# Patient Record
Sex: Female | Born: 1970 | Race: Black or African American | Hispanic: No | Marital: Single | Smoking: Never smoker
Health system: Southern US, Community
[De-identification: ages and names within clinical notes are randomized; demographics above are authoritative.]

## PROBLEM LIST (undated history)

## (undated) DIAGNOSIS — R1310 Dysphagia, unspecified: Secondary | ICD-10-CM

## (undated) DIAGNOSIS — K449 Diaphragmatic hernia without obstruction or gangrene: Secondary | ICD-10-CM

## (undated) DIAGNOSIS — K220 Achalasia of cardia: Secondary | ICD-10-CM

## (undated) DIAGNOSIS — D649 Anemia, unspecified: Secondary | ICD-10-CM

## (undated) DIAGNOSIS — J352 Hypertrophy of adenoids: Secondary | ICD-10-CM

## (undated) DIAGNOSIS — R079 Chest pain, unspecified: Secondary | ICD-10-CM

## (undated) DIAGNOSIS — I10 Essential (primary) hypertension: Secondary | ICD-10-CM

## (undated) DIAGNOSIS — Z789 Other specified health status: Secondary | ICD-10-CM

## (undated) HISTORY — DX: Dysphagia, unspecified: R13.10

## (undated) HISTORY — DX: Diaphragmatic hernia without obstruction or gangrene: K44.9

## (undated) HISTORY — DX: Achalasia of cardia: K22.0

## (undated) HISTORY — DX: Essential (primary) hypertension: I10

## (undated) HISTORY — DX: Chest pain, unspecified: R07.9

## (undated) HISTORY — DX: Anemia, unspecified: D64.9

## (undated) HISTORY — DX: Hypertrophy of adenoids: J35.2

## (undated) HISTORY — PX: NECK SURGERY: SHX720

## (undated) HISTORY — DX: Other specified health status: Z78.9

---

## 1998-06-14 ENCOUNTER — Other Ambulatory Visit: Admission: RE | Admit: 1998-06-14 | Discharge: 1998-06-14 | Payer: Self-pay

## 1998-06-27 ENCOUNTER — Ambulatory Visit (HOSPITAL_COMMUNITY): Admission: RE | Admit: 1998-06-27 | Discharge: 1998-06-27 | Payer: Self-pay | Admitting: *Deleted

## 1998-09-02 ENCOUNTER — Inpatient Hospital Stay (HOSPITAL_COMMUNITY): Admission: AD | Admit: 1998-09-02 | Discharge: 1998-09-02 | Payer: Self-pay | Admitting: *Deleted

## 1998-11-20 ENCOUNTER — Inpatient Hospital Stay (HOSPITAL_COMMUNITY): Admission: AD | Admit: 1998-11-20 | Discharge: 1998-11-20 | Payer: Self-pay | Admitting: Obstetrics

## 1998-11-21 ENCOUNTER — Inpatient Hospital Stay (HOSPITAL_COMMUNITY): Admission: AD | Admit: 1998-11-21 | Discharge: 1998-11-23 | Payer: Self-pay | Admitting: *Deleted

## 1999-03-08 ENCOUNTER — Emergency Department (HOSPITAL_COMMUNITY): Admission: EM | Admit: 1999-03-08 | Discharge: 1999-03-09 | Payer: Self-pay | Admitting: Emergency Medicine

## 1999-03-10 ENCOUNTER — Emergency Department (HOSPITAL_COMMUNITY): Admission: EM | Admit: 1999-03-10 | Discharge: 1999-03-10 | Payer: Self-pay | Admitting: Emergency Medicine

## 1999-04-02 ENCOUNTER — Emergency Department (HOSPITAL_COMMUNITY): Admission: EM | Admit: 1999-04-02 | Discharge: 1999-04-02 | Payer: Self-pay | Admitting: Emergency Medicine

## 1999-04-04 ENCOUNTER — Emergency Department (HOSPITAL_COMMUNITY): Admission: EM | Admit: 1999-04-04 | Discharge: 1999-04-04 | Payer: Self-pay | Admitting: Internal Medicine

## 1999-10-24 ENCOUNTER — Emergency Department (HOSPITAL_COMMUNITY): Admission: EM | Admit: 1999-10-24 | Discharge: 1999-10-24 | Payer: Self-pay | Admitting: Emergency Medicine

## 1999-12-06 ENCOUNTER — Emergency Department (HOSPITAL_COMMUNITY): Admission: EM | Admit: 1999-12-06 | Discharge: 1999-12-06 | Payer: Self-pay | Admitting: Emergency Medicine

## 1999-12-08 ENCOUNTER — Emergency Department (HOSPITAL_COMMUNITY): Admission: EM | Admit: 1999-12-08 | Discharge: 1999-12-08 | Payer: Self-pay | Admitting: Emergency Medicine

## 2000-07-17 ENCOUNTER — Emergency Department (HOSPITAL_COMMUNITY): Admission: EM | Admit: 2000-07-17 | Discharge: 2000-07-17 | Payer: Self-pay | Admitting: Emergency Medicine

## 2000-11-18 ENCOUNTER — Emergency Department (HOSPITAL_COMMUNITY): Admission: EM | Admit: 2000-11-18 | Discharge: 2000-11-18 | Payer: Self-pay | Admitting: Emergency Medicine

## 2001-02-14 ENCOUNTER — Emergency Department (HOSPITAL_COMMUNITY): Admission: EM | Admit: 2001-02-14 | Discharge: 2001-02-14 | Payer: Self-pay | Admitting: *Deleted

## 2001-02-18 ENCOUNTER — Emergency Department (HOSPITAL_COMMUNITY): Admission: EM | Admit: 2001-02-18 | Discharge: 2001-02-18 | Payer: Self-pay | Admitting: Emergency Medicine

## 2003-03-14 ENCOUNTER — Emergency Department (HOSPITAL_COMMUNITY): Admission: EM | Admit: 2003-03-14 | Discharge: 2003-03-14 | Payer: Self-pay | Admitting: Emergency Medicine

## 2004-01-06 ENCOUNTER — Emergency Department (HOSPITAL_COMMUNITY): Admission: EM | Admit: 2004-01-06 | Discharge: 2004-01-06 | Payer: Self-pay | Admitting: Emergency Medicine

## 2004-02-24 ENCOUNTER — Emergency Department (HOSPITAL_COMMUNITY): Admission: EM | Admit: 2004-02-24 | Discharge: 2004-02-24 | Payer: Self-pay | Admitting: *Deleted

## 2004-07-10 ENCOUNTER — Emergency Department (HOSPITAL_COMMUNITY): Admission: EM | Admit: 2004-07-10 | Discharge: 2004-07-10 | Payer: Self-pay | Admitting: Emergency Medicine

## 2005-04-19 ENCOUNTER — Emergency Department (HOSPITAL_COMMUNITY): Admission: EM | Admit: 2005-04-19 | Discharge: 2005-04-19 | Payer: Self-pay | Admitting: Emergency Medicine

## 2005-09-24 ENCOUNTER — Emergency Department (HOSPITAL_COMMUNITY): Admission: EM | Admit: 2005-09-24 | Discharge: 2005-09-24 | Payer: Self-pay | Admitting: Emergency Medicine

## 2005-09-30 ENCOUNTER — Emergency Department (HOSPITAL_COMMUNITY): Admission: EM | Admit: 2005-09-30 | Discharge: 2005-09-30 | Payer: Self-pay | Admitting: Emergency Medicine

## 2005-10-02 ENCOUNTER — Emergency Department (HOSPITAL_COMMUNITY): Admission: EM | Admit: 2005-10-02 | Discharge: 2005-10-02 | Payer: Self-pay | Admitting: Emergency Medicine

## 2005-12-18 ENCOUNTER — Other Ambulatory Visit: Admission: RE | Admit: 2005-12-18 | Discharge: 2005-12-18 | Payer: Self-pay | Admitting: Obstetrics and Gynecology

## 2006-07-03 ENCOUNTER — Emergency Department (HOSPITAL_COMMUNITY): Admission: EM | Admit: 2006-07-03 | Discharge: 2006-07-03 | Payer: Self-pay | Admitting: Family Medicine

## 2006-07-05 ENCOUNTER — Emergency Department (HOSPITAL_COMMUNITY): Admission: EM | Admit: 2006-07-05 | Discharge: 2006-07-05 | Payer: Self-pay | Admitting: Family Medicine

## 2007-03-09 ENCOUNTER — Emergency Department (HOSPITAL_COMMUNITY): Admission: EM | Admit: 2007-03-09 | Discharge: 2007-03-09 | Payer: Self-pay | Admitting: Family Medicine

## 2007-03-15 ENCOUNTER — Emergency Department (HOSPITAL_COMMUNITY): Admission: EM | Admit: 2007-03-15 | Discharge: 2007-03-15 | Payer: Self-pay | Admitting: Emergency Medicine

## 2007-03-17 ENCOUNTER — Emergency Department (HOSPITAL_COMMUNITY): Admission: EM | Admit: 2007-03-17 | Discharge: 2007-03-17 | Payer: Self-pay | Admitting: Family Medicine

## 2007-03-19 ENCOUNTER — Emergency Department (HOSPITAL_COMMUNITY): Admission: EM | Admit: 2007-03-19 | Discharge: 2007-03-19 | Payer: Self-pay | Admitting: Family Medicine

## 2009-12-23 ENCOUNTER — Emergency Department (HOSPITAL_COMMUNITY): Admission: EM | Admit: 2009-12-23 | Discharge: 2009-12-23 | Payer: Self-pay | Admitting: Family Medicine

## 2010-03-14 ENCOUNTER — Emergency Department (HOSPITAL_COMMUNITY): Admission: EM | Admit: 2010-03-14 | Discharge: 2010-03-14 | Payer: Self-pay | Admitting: Emergency Medicine

## 2010-07-30 ENCOUNTER — Encounter: Admission: RE | Admit: 2010-07-30 | Discharge: 2010-07-30 | Payer: Self-pay | Admitting: Family Medicine

## 2010-08-12 ENCOUNTER — Emergency Department (HOSPITAL_COMMUNITY): Admission: EM | Admit: 2010-08-12 | Discharge: 2010-08-12 | Payer: Self-pay | Admitting: Emergency Medicine

## 2010-08-14 ENCOUNTER — Emergency Department (HOSPITAL_COMMUNITY): Admission: EM | Admit: 2010-08-14 | Discharge: 2010-08-14 | Payer: Self-pay | Admitting: Emergency Medicine

## 2010-09-23 ENCOUNTER — Emergency Department (HOSPITAL_COMMUNITY): Admission: EM | Admit: 2010-09-23 | Discharge: 2010-09-23 | Payer: Self-pay | Admitting: Family Medicine

## 2010-11-25 ENCOUNTER — Emergency Department (HOSPITAL_COMMUNITY)
Admission: EM | Admit: 2010-11-25 | Discharge: 2010-11-25 | Payer: Self-pay | Source: Home / Self Care | Admitting: Family Medicine

## 2011-01-15 LAB — POCT RAPID STREP A (OFFICE): Streptococcus, Group A Screen (Direct): POSITIVE — AB

## 2011-05-08 ENCOUNTER — Inpatient Hospital Stay (INDEPENDENT_AMBULATORY_CARE_PROVIDER_SITE_OTHER)
Admission: RE | Admit: 2011-05-08 | Discharge: 2011-05-08 | Disposition: A | Discharge status: Home / Self Care | Payer: BC Managed Care – PPO | Source: Ambulatory Visit | Attending: Family Medicine | Admitting: Family Medicine

## 2011-05-08 DIAGNOSIS — H6120 Impacted cerumen, unspecified ear: Secondary | ICD-10-CM

## 2011-05-20 ENCOUNTER — Inpatient Hospital Stay (INDEPENDENT_AMBULATORY_CARE_PROVIDER_SITE_OTHER)
Admission: RE | Admit: 2011-05-20 | Discharge: 2011-05-20 | Disposition: A | Discharge status: Home / Self Care | Payer: BC Managed Care – PPO | Source: Ambulatory Visit | Attending: Family Medicine | Admitting: Family Medicine

## 2011-05-20 DIAGNOSIS — J029 Acute pharyngitis, unspecified: Secondary | ICD-10-CM

## 2011-09-08 ENCOUNTER — Other Ambulatory Visit (HOSPITAL_COMMUNITY): Payer: Self-pay | Admitting: Otolaryngology

## 2011-09-10 ENCOUNTER — Other Ambulatory Visit (HOSPITAL_COMMUNITY): Payer: Self-pay | Admitting: Otolaryngology

## 2011-09-11 ENCOUNTER — Ambulatory Visit (HOSPITAL_COMMUNITY)
Admission: RE | Admit: 2011-09-11 | Discharge: 2011-09-11 | Disposition: A | Discharge status: Home / Self Care | Payer: BC Managed Care – PPO | Source: Ambulatory Visit | Attending: Otolaryngology | Admitting: Otolaryngology

## 2011-09-11 ENCOUNTER — Other Ambulatory Visit (HOSPITAL_COMMUNITY): Payer: BC Managed Care – PPO

## 2011-09-11 DIAGNOSIS — R1310 Dysphagia, unspecified: Secondary | ICD-10-CM | POA: Insufficient documentation

## 2011-09-11 NOTE — Procedures (Signed)
Clinical Impressions:  Pt presents with normal oropharyngeal swallow.  Esophageal screen revealed slowed emptying of solid foods through LES.  Pt for esophagram after MBS -  refer to that study for detailed information. Recommendations:  Continue regular diet with thin liquids; alternate solids with liquids to facilitate solid food transfer.

## 2011-09-15 ENCOUNTER — Encounter: Payer: Self-pay | Admitting: Gastroenterology

## 2011-10-01 ENCOUNTER — Ambulatory Visit (INDEPENDENT_AMBULATORY_CARE_PROVIDER_SITE_OTHER): Payer: BC Managed Care – PPO | Admitting: Gastroenterology

## 2011-10-01 ENCOUNTER — Encounter: Payer: Self-pay | Admitting: Gastroenterology

## 2011-10-01 DIAGNOSIS — R1310 Dysphagia, unspecified: Secondary | ICD-10-CM

## 2011-10-01 DIAGNOSIS — I10 Essential (primary) hypertension: Secondary | ICD-10-CM

## 2011-10-01 NOTE — Patient Instructions (Addendum)
You will be set up for an upper endoscopy. Samples of PPI given, take one pill once daily 20-30 min before a meal. GERD handout given.   Gastroesophageal reflux disease (GERD) happens when acid from your stomach flows up into the esophagus. When acid comes in contact with the esophagus, the acid causes soreness (inflammation) in the esophagus. Over time, GERD may create small holes (ulcers) in the lining of the esophagus. CAUSES   Increased body weight. This puts pressure on the stomach, making acid rise from the stomach into the esophagus.   Smoking. This increases acid production in the stomach.   Drinking alcohol. This causes decreased pressure in the lower esophageal sphincter (valve or ring of muscle between the esophagus and stomach), allowing acid from the stomach into the esophagus.   Late evening meals and a full stomach. This increases pressure and acid production in the stomach.   A malformed lower esophageal sphincter.  Sometimes, no cause is found. SYMPTOMS   Burning pain in the lower part of the mid-chest behind the breastbone and in the mid-stomach area. This may occur twice a week or more often.   Trouble swallowing.   Sore throat.   Dry cough.   Asthma-like symptoms including chest tightness, shortness of breath, or wheezing.  DIAGNOSIS  Your caregiver may be able to diagnose GERD based on your symptoms. In some cases, X-rays and other tests may be done to check for complications or to check the condition of your stomach and esophagus. TREATMENT  Your caregiver may recommend over-the-counter or prescription medicines to help decrease acid production. Ask your caregiver before starting or adding any new medicines.  HOME CARE INSTRUCTIONS   Change the factors that you can control. Ask your caregiver for guidance concerning weight loss, quitting smoking, and alcohol consumption.   Avoid foods and drinks that make your symptoms worse, such as:   Caffeine or  alcoholic drinks.   Chocolate.   Peppermint or mint flavorings.   Garlic and onions.   Spicy foods.   Citrus fruits, such as oranges, lemons, or limes.   Tomato-based foods such as sauce, chili, salsa, and pizza.   Fried and fatty foods.   Avoid lying down for the 3 hours prior to your bedtime or prior to taking a nap.   Eat small, frequent meals instead of large meals.   Wear loose-fitting clothing. Do not wear anything tight around your waist that causes pressure on your stomach.   Raise the head of your bed 6 to 8 inches with wood blocks to help you sleep. Extra pillows will not help.   Only take over-the-counter or prescription medicines for pain, discomfort, or fever as directed by your caregiver.   Do not take aspirin, ibuprofen, or other nonsteroidal anti-inflammatory drugs (NSAIDs).  SEEK IMMEDIATE MEDICAL CARE IF:   You have pain in your arms, neck, jaw, teeth, or back.   Your pain increases or changes in intensity or duration.   You develop nausea, vomiting, or sweating (diaphoresis).   You develop shortness of breath, or you faint.   Your vomit is green, yellow, black, or looks like coffee grounds or blood.   Your stool is red, bloody, or black.  These symptoms could be signs of other problems, such as heart disease, gastric bleeding, or esophageal bleeding. MAKE SURE YOU:   Understand these instructions.   Will watch your condition.   Will get help right away if you are not doing well or get worse.  Document  Released: 07/23/2005 Document Revised: 06/25/2011 Document Reviewed: 05/02/2011 [facility] Patient Information 2012 [place], [place].

## 2011-10-01 NOTE — Progress Notes (Signed)
MODIFIED BARIUM SWALLOW [SLP1002] last month       Clinical Impressions: Pt presents with normal oropharyngeal swallow. Esophageal screen revealed slowed emptying of solid foods through LES. Pt for esophagram after MBS - refer to that study for detailed information.  Recommendations: Continue regular diet with thin liquids; alternate solids with liquids to facilitate solid food transfer.    Barium esophagram last month: IMPRESSION:  1. Esophageal dysmotility.  2. Tiny hiatal hernia.  3. Delayed passage of a barium pill at the distal esophagus. This  is presumably related to dysmotility.   HPI: This is a  very pleasant 40 year old woman whom I am meeting for the first time today  Having swalloiwng troubles for 8 months.  When she swallows feels like something is stuck in throat, has to swallow very hard to get it down.  Has to retch to get it out at times.  There is no dysphagia.  She feels like choking.  OVerall her weight is up 15 pounds this year. Not a lot of pyrosis, does not take meds for it.  Has intermittent pains in chest that have been attributed to esophag spasms.   Took nexium, and the 3 days of pains improved.  Took nexium for 4-5 days then stopped.   She was on antibiotics 6 months ago for skin boil.  This was probably after the   ENT MD did examination.   Dysphagia is not progressive, is mainly solids.    She has daily dysphagia, 2 times a week severe symptoms.  She had the modified barium swallow and barium esophagram, and those results are summarized above.   She does not drink much caffeine or alcohol and does not smoke cigarettes. She does chew peppermint gum quite frequently  Review of systems: Pertinent positive and negative review of systems were noted in the above HPI section. Complete review of systems was performed and was otherwise normal.    Past Medical History  Diagnosis Date  . Chest pain, unspecified   . Unspecified essential hypertension   .  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 Achalasia and cardiospasm   . Hypertrophy of adenoids alone   . Dysphagia, unspecified   . Anemia     History reviewed. No pertinent past surgical history.  Current Outpatient Prescriptions  Medication Sig Dispense Refill  . hydrochlorothiazide (HYDRODIURIL) 25 MG tablet Take 25 mg by mouth daily.          Allergies as of 10/01/2011  . (No Known Allergies)    Family History  Problem Relation Age of Onset  . Diabetes Father   . Hypertension Mother   . Stroke Mother     History   Social History  . Marital Status: Single    Spouse Name: N/A    Number of Children: 1  . Years of Education: N/A   Occupational History  . Geologist, engineering    Social History Main Topics  . Smoking status: Never Smoker   . Smokeless tobacco: Never Used  . Alcohol Use: No  . Drug Use: No  . Sexually Active: Not on file   Other Topics Concern  . Not on file   Social History Narrative  . No narrative on file       Physical Exam: BP 120/72  Pulse 80  Ht 5\' 7"  (1.702 m)  Wt 238 lb (107.956 kg)  BMI 37.28 kg/m2  SpO2 98%  LMP 08/23/2011 Constitutional: generally well-appearing Psychiatric: alert and oriented x3  Eyes: extraocular movements intact Mouth: oral pharynx moist, no lesions Neck: supple no lymphadenopathy Cardiovascular: heart regular rate and rhythm Lungs: clear to auscultation bilaterally Abdomen: soft, nontender, nondistended, no obvious ascites, no peritoneal signs, normal bowel sounds Extremities: no lower extremity edema bilaterally Skin: no lesions on visible extremities    Assessment and plan: 40 y.o. female with   intermittent, nonprogressive, predominantly solid food dysphasia  I will proceed with EGD at her soonest convenience. In the meantime she will begin proton pump inhibitor once daily as a clinical test to see if her symptoms are gerd related. Other possibilities include  infection, motility disorder, stricture.

## 2011-11-03 ENCOUNTER — Encounter: Payer: Self-pay | Admitting: Gastroenterology

## 2011-11-03 ENCOUNTER — Ambulatory Visit (AMBULATORY_SURGERY_CENTER): Payer: BC Managed Care – PPO | Admitting: Gastroenterology

## 2011-11-03 VITALS — BP 126/74 | HR 90 | Temp 99.0°F | Resp 18 | Ht 67.0 in | Wt 238.0 lb

## 2011-11-03 DIAGNOSIS — R1310 Dysphagia, unspecified: Secondary | ICD-10-CM

## 2011-11-03 MED ORDER — SODIUM CHLORIDE 0.9 % IV SOLN: 500.0000 mL | INTRAVENOUS | Status: DC

## 2011-11-03 MED ORDER — OMEPRAZOLE 40 MG PO CPDR: 40.0000 mg | DELAYED_RELEASE_CAPSULE | Freq: Every day | ORAL | Status: DC

## 2011-11-03 NOTE — Op Note (Signed)
[facility] [address] [place], [place]  [address]  ENDOSCOPY PROCEDURE REPORT  PATIENT:  Martha Davis, Martha Davis  MR#:  [id number] BIRTHDATE:  06-Aug-1971, 40 yrs. old  GENDER:  female ENDOSCOPIST:  Rachael Fee, MD Referred by:  Alwyn Pea, M.D. PROCEDURE DATE:  11/03/2011 PROCEDURE:  EGD with biopsy, 432[address] ASA CLASS:  Class II INDICATIONS:  GERD, intermittent dysphagia MEDICATIONS:   Fentanyl 100 mcg IV, These medications were titrated to patient response per physician's verbal order, Versed 10 mg IV TOPICAL ANESTHETIC:  Cetacaine Spray  DESCRIPTION OF PROCEDURE:   After the risks benefits and alternatives of the procedure were thoroughly explained, informed consent was obtained.  The [facility] [id number] endoscope was introduced through the mouth and advanced to the second portion of the duodenum, without limitations.  The instrument was slowly withdrawn as the mucosa was fully examined. <<PROCEDUREIMAGES>>  The upper, middle, and distal third of the esophagus were carefully inspected and no abnormalities were noted. The z-line was well seen at the GEJ. The endoscope was pushed into the fundus which was normal including a retroflexed view. The antrum,gastric body, first and second part of the duodenum were unremarkable (see image1, image2, and image3).  Biopsies were taken from distal (jar 1) andproximal (jar 2) esophagus. Retroflexed views revealed no abnormalities.    The scope was then withdrawn from the patient and the procedure completed.  COMPLICATIONS:  None  ENDOSCOPIC IMPRESSION: 1) Normal EGD 2) Esophagus was biopsied to check for eosinophilic esophagitis  RECOMMENDATIONS: Await final biopsies to check for Eosinophilic Esophagitis. In the meantime a new prescription was called into your pharmacy for antiacid medicine since the PPI samples did seem to help your symptoms.  ______________________________ Rachael Fee, MD  n. eSIGNED:   Rachael Fee at 11/03/2011 09:42 AM  Minda Meo, [phone]

## 2011-11-03 NOTE — Progress Notes (Signed)
Patient did not have preoperative order for IV antibiotic SSI prophylaxis. (G8918)  Patient did not experience any of the following events: a burn prior to discharge; a fall within the facility; wrong site/side/patient/procedure/implant event; or a hospital transfer or hospital admission upon discharge from the facility. (G8907)  

## 2011-11-03 NOTE — Patient Instructions (Signed)
Your biopsies will be back in about two weeks.   The results will be mailed to your home.  You may resume all of your routine meds with the exception of the new medication (prilosec). Please take that once daily 1/2 hr before breakfast.  If you have any questions, please call us at [phone]. Thank-you.

## 2011-11-04 ENCOUNTER — Telehealth: Payer: Self-pay | Admitting: *Deleted

## 2011-11-04 NOTE — Telephone Encounter (Signed)
No answer. Message left. 

## 2011-11-10 ENCOUNTER — Encounter: Payer: Self-pay | Admitting: Gastroenterology

## 2011-11-11 ENCOUNTER — Encounter: Payer: Self-pay | Admitting: *Deleted

## 2012-02-12 DIAGNOSIS — D219 Benign neoplasm of connective and other soft tissue, unspecified: Secondary | ICD-10-CM

## 2012-02-12 DIAGNOSIS — N921 Excessive and frequent menstruation with irregular cycle: Secondary | ICD-10-CM

## 2012-02-12 DIAGNOSIS — D649 Anemia, unspecified: Secondary | ICD-10-CM

## 2012-02-13 ENCOUNTER — Ambulatory Visit (INDEPENDENT_AMBULATORY_CARE_PROVIDER_SITE_OTHER): Payer: BC Managed Care – PPO | Admitting: Obstetrics and Gynecology

## 2012-02-13 ENCOUNTER — Other Ambulatory Visit: Payer: Self-pay | Admitting: Obstetrics and Gynecology

## 2012-02-13 ENCOUNTER — Ambulatory Visit (INDEPENDENT_AMBULATORY_CARE_PROVIDER_SITE_OTHER): Payer: BC Managed Care – PPO

## 2012-02-13 ENCOUNTER — Encounter: Payer: Self-pay | Admitting: Obstetrics and Gynecology

## 2012-02-13 DIAGNOSIS — D259 Leiomyoma of uterus, unspecified: Secondary | ICD-10-CM

## 2012-02-13 DIAGNOSIS — N920 Excessive and frequent menstruation with regular cycle: Secondary | ICD-10-CM

## 2012-02-13 DIAGNOSIS — N921 Excessive and frequent menstruation with irregular cycle: Secondary | ICD-10-CM

## 2012-02-13 NOTE — Progress Notes (Signed)
Sonohysterogram: Irreg endometrium, no submucosal fibroid  ENDOMETRIAL BIOPSY   INDICATION: intermenstrual bleeding  LMP:  UPT: Negative  Ultrasound findings: abnormal  with multiple fibroids   Cervix prepped with betadine Tenaculum applied to anterior lip of the cervix: no Uterus sounded at  10  cm Endometrial biopsy easily performed with Pipelle Well tolerated  Specimen appropriately identified and sent to pathology  Follow-up:  Will schedule abdominal myomectomy per pt request   Chaslyn Eisen A MD 02/13/2012 3:58 PM

## 2012-02-17 ENCOUNTER — Telehealth: Payer: Self-pay | Admitting: Obstetrics and Gynecology

## 2012-02-17 LAB — PATHOLOGY

## 2012-02-20 ENCOUNTER — Other Ambulatory Visit: Payer: Self-pay | Admitting: Obstetrics and Gynecology

## 2012-03-16 ENCOUNTER — Telehealth: Payer: Self-pay | Admitting: Obstetrics and Gynecology

## 2012-03-16 NOTE — Telephone Encounter (Signed)
ABDOMINAL MYOMECTOMY SCHEDULED FOR 05/11/12 AT 7:30 WITH SR/EP.  BCBS EFFECTIVE 04/27/11. PLAN PAYS 70/30 AFTER A $933 DEDUCTIBLE.  PRE-OP DUE $468.45 -Adrianne Pridgen

## 2012-05-04 ENCOUNTER — Encounter: Payer: BC Managed Care – PPO | Admitting: Obstetrics and Gynecology

## 2012-05-11 ENCOUNTER — Encounter (HOSPITAL_COMMUNITY): Admission: RE | Payer: Self-pay | Source: Ambulatory Visit

## 2012-05-11 ENCOUNTER — Ambulatory Visit (HOSPITAL_COMMUNITY)
Admission: RE | Admit: 2012-05-11 | Payer: BC Managed Care – PPO | Source: Ambulatory Visit | Admitting: Obstetrics and Gynecology

## 2012-05-11 SURGERY — MYOMECTOMY, ABDOMINAL APPROACH
Anesthesia: General

## 2012-05-19 ENCOUNTER — Other Ambulatory Visit: Payer: Self-pay | Admitting: Family Medicine

## 2012-05-19 DIAGNOSIS — Z1231 Encounter for screening mammogram for malignant neoplasm of breast: Secondary | ICD-10-CM

## 2012-05-27 ENCOUNTER — Encounter: Payer: BC Managed Care – PPO | Admitting: Obstetrics and Gynecology

## 2012-06-01 ENCOUNTER — Ambulatory Visit: Payer: BC Managed Care – PPO

## 2012-06-08 ENCOUNTER — Ambulatory Visit: Payer: BC Managed Care – PPO

## 2013-01-17 ENCOUNTER — Ambulatory Visit
Admission: RE | Admit: 2013-01-17 | Discharge: 2013-01-17 | Disposition: A | Discharge status: Home / Self Care | Payer: BC Managed Care – PPO | Source: Ambulatory Visit | Attending: Family Medicine | Admitting: Family Medicine

## 2013-01-17 DIAGNOSIS — Z1231 Encounter for screening mammogram for malignant neoplasm of breast: Secondary | ICD-10-CM

## 2013-01-19 ENCOUNTER — Other Ambulatory Visit: Payer: Self-pay | Admitting: Family Medicine

## 2013-01-19 DIAGNOSIS — N63 Unspecified lump in unspecified breast: Secondary | ICD-10-CM

## 2013-02-02 ENCOUNTER — Ambulatory Visit
Admission: RE | Admit: 2013-02-02 | Discharge: 2013-02-02 | Disposition: A | Discharge status: Home / Self Care | Payer: BC Managed Care – PPO | Source: Ambulatory Visit | Attending: Family Medicine | Admitting: Family Medicine

## 2013-02-02 DIAGNOSIS — N63 Unspecified lump in unspecified breast: Secondary | ICD-10-CM

## 2013-07-21 ENCOUNTER — Emergency Department (HOSPITAL_COMMUNITY)
Admission: EM | Admit: 2013-07-21 | Discharge: 2013-07-21 | Disposition: A | Discharge status: Home / Self Care | Payer: BC Managed Care – PPO | Source: Home / Self Care | Attending: Family Medicine | Admitting: Family Medicine

## 2013-07-21 ENCOUNTER — Encounter (HOSPITAL_COMMUNITY): Payer: Self-pay | Admitting: Emergency Medicine

## 2013-07-21 DIAGNOSIS — M79609 Pain in unspecified limb: Secondary | ICD-10-CM

## 2013-07-21 DIAGNOSIS — M79671 Pain in right foot: Secondary | ICD-10-CM

## 2013-07-21 DIAGNOSIS — M129 Arthropathy, unspecified: Secondary | ICD-10-CM

## 2013-07-21 DIAGNOSIS — M1990 Unspecified osteoarthritis, unspecified site: Secondary | ICD-10-CM

## 2013-07-21 MED ORDER — METHYLPREDNISOLONE 4 MG PO KIT: PACK | ORAL | Status: DC

## 2013-07-21 MED ORDER — NAPROXEN 500 MG PO TABS: 500.0000 mg | ORAL_TABLET | Freq: Two times a day (BID) | ORAL | Status: DC

## 2013-07-21 NOTE — ED Provider Notes (Signed)
CSN: [id number]     Arrival date & time 07/21/13  1214 History   First MD Initiated Contact with Patient 07/21/13 1415     Chief Complaint  Patient presents with  . Foot Pain   (Consider location/radiation/quality/duration/timing/severity/associated sxs/prior Treatment) HPI Comments: 42 year old female presents complaining of right lateral foot pain for the past 2 days. She denies any injury and she cannot think of anything that could be causing the pain. She denies having any new shoes or a new increased level of activity. She says the area is painful with ambulation and is tender to touch. It feels slightly swollen to her as well. She denies any history of PE or DVT or any recent travel. She has been taking Advil which has been helpful.   Past Medical History  Diagnosis Date  . Chest pain, unspecified   . Unspecified essential hypertension   . 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 Achalasia and cardiospasm   . Hypertrophy of adenoids alone   . Dysphagia, unspecified(787.20)   . Anemia    History reviewed. No pertinent past surgical history. Family History  Problem Relation Age of Onset  . Diabetes Father   . Hypertension Mother   . Stroke Mother    History  Substance Use Topics  . Smoking status: Never Smoker   . Smokeless tobacco: Never Used  . Alcohol Use: No   OB History   Grav Para Term Preterm Abortions TAB SAB Ect Mult Living   1 1 1       1      Review of Systems  Constitutional: Negative for fever and chills.  Eyes: Negative for visual disturbance.  Respiratory: Negative for cough and shortness of breath.   Cardiovascular: Negative for chest pain, palpitations and leg swelling.  Gastrointestinal: Negative for nausea, vomiting and abdominal pain.  Endocrine: Negative for polydipsia and polyuria.  Genitourinary: Negative for dysuria, urgency and frequency.  Musculoskeletal:       See HPI  Skin: Negative for rash.  Neurological:  Negative for dizziness, weakness and light-headedness.    Allergies  Review of patient's allergies indicates no known allergies.  Home Medications   Current Outpatient Rx  Name  Route  Sig  Dispense  Refill  . hydrochlorothiazide (HYDRODIURIL) 25 MG tablet   Oral   Take 25 mg by mouth daily.           . methylPREDNISolone (MEDROL DOSEPAK) 4 MG tablet      follow package directions   21 tablet   0     Dispense as written.   . naproxen (NAPROSYN) 500 MG tablet   Oral   Take 1 tablet (500 mg total) by mouth 2 (two) times daily with a meal.   60 tablet   0   . EXPIRED: omeprazole (PRILOSEC) 40 MG capsule   Oral   Take 1 capsule (40 mg total) by mouth daily.   30 capsule   6    BP 127/77  Pulse 80  Temp(Src) 97.8 F (36.6 C) (Oral)  Resp 18  SpO2 100% Physical Exam  Nursing note and vitals reviewed. Constitutional: She is oriented to person, place, and time. Vital signs are normal. She appears well-developed and well-nourished. No distress.  HENT:  Head: Normocephalic and atraumatic.  Pulmonary/Chest: Effort normal. No respiratory distress.  Musculoskeletal:       Left foot: She exhibits tenderness and swelling. She exhibits normal range of motion, no crepitus and no deformity.  Feet:  Neurological: She is alert and oriented to person, place, and time. She has normal strength. Coordination normal.  Skin: Skin is warm and dry. No rash noted. She is not diaphoretic.  Psychiatric: She has a normal mood and affect. Judgment normal.    ED Course  Procedures (including critical care time) Labs Review Labs Reviewed - No data to display Imaging Review No results found.  MDM   1. Foot pain, right   2. 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 NSAIDs, followup with primary care   Meds ordered this encounter  Medications  . methylPREDNISolone (MEDROL DOSEPAK) 4 MG tablet    Sig: follow package directions     Dispense:  21 tablet    Refill:  0  . naproxen (NAPROSYN) 500 MG tablet    Sig: Take 1 tablet (500 mg total) by mouth 2 (two) times daily with a meal.    Dispense:  60 tablet    Refill:  0       Graylon Good, PA-C 07/22/13 [phone]

## 2013-07-21 NOTE — ED Notes (Signed)
Right foot pain, pain includes little toe and outer edge of right foot.  No known injury.  Onset 2 days ago of pain.  Has pain with and without weight bearing.  Pulses 2 plus, able to move toes

## 2013-07-22 NOTE — ED Provider Notes (Signed)
Medical screening examination/treatment/procedure(s) were performed by resident physician or non-physician practitioner and as supervising physician I was immediately available for consultation/collaboration.   Gaylen Venning DOUGLAS MD.   Morty Ortwein D Jolee Critcher, MD 07/22/13 0943 

## 2014-08-24 ENCOUNTER — Emergency Department (HOSPITAL_COMMUNITY)
Admission: EM | Admit: 2014-08-24 | Discharge: 2014-08-24 | Disposition: A | Discharge status: Home / Self Care | Payer: BC Managed Care – PPO | Source: Home / Self Care | Attending: Family Medicine | Admitting: Family Medicine

## 2014-08-24 ENCOUNTER — Encounter (HOSPITAL_COMMUNITY): Payer: Self-pay | Admitting: Emergency Medicine

## 2014-08-24 DIAGNOSIS — L02411 Cutaneous abscess of right axilla: Secondary | ICD-10-CM

## 2014-08-24 MED ORDER — SULFAMETHOXAZOLE-TRIMETHOPRIM 800-160 MG PO TABS: 2.0000 | ORAL_TABLET | Freq: Two times a day (BID) | ORAL | Status: DC

## 2014-08-24 MED ORDER — TRAMADOL HCL 50 MG PO TABS: 50.0000 mg | ORAL_TABLET | Freq: Four times a day (QID) | ORAL | Status: DC | PRN

## 2014-08-24 MED ORDER — LIDOCAINE-EPINEPHRINE (PF) 2 %-1:200000 IJ SOLN
INTRAMUSCULAR | Status: AC
  Filled 2014-08-24: qty 20

## 2014-08-24 NOTE — ED Provider Notes (Signed)
Martha Davis is a 43 y.o. female who presents to Urgent Care today for abscess. Patient has an abscess in her right axilla present for several days worsening recently. No fevers or chills nausea vomiting or diarrhea. Pain is moderate. Symptoms are consistent with previous episodes of abscess.   Past Medical History  Diagnosis Date  . Chest pain, unspecified   . Unspecified essential hypertension   . 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 Achalasia and cardiospasm   . Hypertrophy of adenoids alone   . Dysphagia, unspecified(787.20)   . Anemia    History  Substance Use Topics  . Smoking status: Never Smoker   . Smokeless tobacco: Never Used  . Alcohol Use: No   ROS as above Medications: No current facility-administered medications for this encounter.   Current Outpatient Prescriptions  Medication Sig Dispense Refill  . ferrous sulfate 325 (65 FE) MG tablet Take 325 mg by mouth daily with breakfast.      . hydrochlorothiazide (HYDRODIURIL) 25 MG tablet Take 25 mg by mouth daily.        Marland Kitchen sulfamethoxazole-trimethoprim (SEPTRA DS) 800-160 MG per tablet Take 2 tablets by mouth 2 (two) times daily.  28 tablet  0  . traMADol (ULTRAM) 50 MG tablet Take 1 tablet (50 mg total) by mouth every 6 (six) hours as needed.  15 tablet  0  . [DISCONTINUED] omeprazole (PRILOSEC) 40 MG capsule Take 1 capsule (40 mg total) by mouth daily.  30 capsule  6    Exam:  BP 137/82  Pulse 82  Temp(Src) 98.1 F (36.7 C) (Oral)  Resp 16  Ht 5\' 7"  (1.702 m)  Wt 214 lb (97.07 kg)  BMI 33.51 kg/m2  SpO2 100%  LMP 08/10/2014 Gen: Well NAD Right axilla: Multiple areas of thick scarring. Tender induration present. Minimal fluctuance present. No pus expressible.  Abscess incision and drainage. Consent obtained and timeout performed. Skin cleaned with alcohol. 3 mL of lidocaine with epinephrine injected achieving good anesthesia. Scalpel used to incise to the probable area of  pus. Minimal amount of pus expressed. Blunt dissection was used to explore the wound and break up loculations. No further pus was expressed. A dressing was applied.  No results found for this or any previous visit (from the past 24 hour(s)). No results found.  Assessment and Plan: 43 y.o. female with abscess of the right axilla. Treatment with Bactrim. Follow-up as needed.  Discussed warning signs or symptoms. Please see discharge instructions. Patient expresses understanding.     Gregor Hams, MD 08/24/14 [phone]

## 2014-08-24 NOTE — Discharge Instructions (Signed)
Thank you for coming in today. Take Bactrim twice daily for antibiotics. Use tramadol for pain medication. Come back as needed.   Abscess Care After An abscess (also called a boil or furuncle) is an infected area that contains a collection of pus. Signs and symptoms of an abscess include pain, tenderness, redness, or hardness, or you may feel a moveable soft area under your skin. An abscess can occur anywhere in the body. The infection may spread to surrounding tissues causing cellulitis. A cut (incision) by the surgeon was made over your abscess and the pus was drained out. Gauze may have been packed into the space to provide a drain that will allow the cavity to heal from the inside outwards. The boil may be painful for 5 to 7 days. Most people with a boil do not have high fevers. Your abscess, if seen early, may not have localized, and may not have been lanced. If not, another appointment may be required for this if it does not get better on its own or with medications. HOME CARE INSTRUCTIONS   Only take over-the-counter or prescription medicines for pain, discomfort, or fever as directed by your caregiver.  When you bathe, soak and then remove gauze or iodoform packs at least daily or as directed by your caregiver. You may then wash the wound gently with mild soapy water. Repack with gauze or do as your caregiver directs. SEEK IMMEDIATE MEDICAL CARE IF:   You develop increased pain, swelling, redness, drainage, or bleeding in the wound site.  You develop signs of generalized infection including muscle aches, chills, fever, or a general ill feeling.  An oral temperature above 102 F (38.9 C) develops, not controlled by medication. See your caregiver for a recheck if you develop any of the symptoms described above. If medications (antibiotics) were prescribed, take them as directed. Document Released: 05/01/2005 Document Revised: 01/05/2012 Document Reviewed: 12/27/2007 [facility] Patient  Information 2015 [place], [place]. This information is not intended to replace advice given to you by your health care provider. Make sure you discuss any questions you have with your health care provider.

## 2014-08-24 NOTE — ED Notes (Signed)
Pt states that she has a abcess on her right arm near the axillary. States she gets those frequently. Pt denies pain at this time. Pt is in no acute distress at this time.

## 2014-08-26 NOTE — ED Notes (Signed)
Call from patient aprox 2 pm, c/o nausea w her antibiotics rx yesterday, asking for new antibiotics. No reports available from lab on culture of axillary abscess.  Dr Georgina Snell not in clinic today to discuss. Spoke w Dr Bridgett Larsson, and will keep patient on same Rx for now, and add Zofran 8 mg, 1 q 8 hours PRN nausea , #20. Called in to [facility], [place], spoke directly w pharmacist . Patient advised Sulfa is frequently the Rx needed for skin type infections, but still may need to be changed. Call back number for these issues verified

## 2014-08-28 ENCOUNTER — Encounter (HOSPITAL_COMMUNITY): Payer: Self-pay | Admitting: Emergency Medicine

## 2015-07-19 ENCOUNTER — Emergency Department (HOSPITAL_COMMUNITY)
Admission: EM | Admit: 2015-07-19 | Discharge: 2015-07-19 | Disposition: A | Discharge status: Home / Self Care | Payer: BC Managed Care – PPO | Attending: Emergency Medicine | Admitting: Emergency Medicine

## 2015-07-19 ENCOUNTER — Encounter (HOSPITAL_COMMUNITY): Payer: Self-pay

## 2015-07-19 ENCOUNTER — Emergency Department (HOSPITAL_COMMUNITY): Payer: BC Managed Care – PPO

## 2015-07-19 DIAGNOSIS — Z8719 Personal history of other diseases of the digestive system: Secondary | ICD-10-CM | POA: Diagnosis not present

## 2015-07-19 DIAGNOSIS — M25562 Pain in left knee: Secondary | ICD-10-CM | POA: Diagnosis present

## 2015-07-19 DIAGNOSIS — I10 Essential (primary) hypertension: Secondary | ICD-10-CM | POA: Insufficient documentation

## 2015-07-19 DIAGNOSIS — D649 Anemia, unspecified: Secondary | ICD-10-CM | POA: Insufficient documentation

## 2015-07-19 DIAGNOSIS — Z79899 Other long term (current) drug therapy: Secondary | ICD-10-CM | POA: Insufficient documentation

## 2015-07-19 DIAGNOSIS — Z8709 Personal history of other diseases of the respiratory system: Secondary | ICD-10-CM | POA: Insufficient documentation

## 2015-07-19 DIAGNOSIS — M25462 Effusion, left knee: Secondary | ICD-10-CM | POA: Insufficient documentation

## 2015-07-19 DIAGNOSIS — M1712 Unilateral primary osteoarthritis, left knee: Secondary | ICD-10-CM

## 2015-07-19 MED ORDER — IBUPROFEN 800 MG PO TABS
800.0000 mg | ORAL_TABLET | Freq: Once | ORAL | Status: AC
  Administered 2015-07-19: 800 mg via ORAL
  Filled 2015-07-19: qty 1

## 2015-07-19 MED ORDER — IBUPROFEN 800 MG PO TABS: 800.0000 mg | ORAL_TABLET | Freq: Three times a day (TID) | ORAL | Status: DC

## 2015-07-19 NOTE — ED Notes (Addendum)
Pt presents with 2 month h/o L knee pain.  Pt unsure of injury, reports intermittent swelling, redness and warmth. Pt reports going to OSH with plain films done, took otc medication which has not helped, with symptoms worsening.

## 2015-07-19 NOTE — ED Provider Notes (Signed)
CSN: [id number]     Arrival date & time 07/19/15  1047 History   First MD Initiated Contact with Patient 07/19/15 1113     No chief complaint on file.    (Consider location/radiation/quality/duration/timing/severity/associated sxs/prior Treatment) Patient is a 44 y.o. female presenting with knee pain. The history is provided by the patient.  Knee Pain Location:  Knee Time since incident:  2 months Injury: no   Knee location:  L knee Pain details:    Quality:  Sharp   Radiates to:  Does not radiate   Severity:  Moderate   Onset quality:  Gradual   Timing:  Intermittent   Progression:  Waxing and waning Chronicity:  New Prior injury to area:  No Relieved by:  Nothing Worsened by:  Nothing tried Ineffective treatments:  None tried Associated symptoms: stiffness and swelling   Associated symptoms: no decreased ROM   Risk factors: obesity     Past Medical History  Diagnosis Date  . Chest pain, unspecified   . Unspecified essential hypertension   . 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 Achalasia and cardiospasm   . Hypertrophy of adenoids alone   . Dysphagia, unspecified(787.20)   . Anemia    History reviewed. No pertinent past surgical history. Family History  Problem Relation Age of Onset  . Diabetes Father   . Hypertension Mother   . Stroke Mother    Social History  Substance Use Topics  . Smoking status: Never Smoker   . Smokeless tobacco: Never Used  . Alcohol Use: No   OB History    Gravida Para Term Preterm AB TAB SAB Ectopic Multiple Living   1 1 1       1      Review of Systems  Musculoskeletal: Positive for stiffness.  All other systems reviewed and are negative.     Allergies  Review of patient's allergies indicates no known allergies.  Home Medications   Prior to Admission medications   Medication Sig Start Date End Date Taking? Authorizing Provider  ferrous sulfate 325 (65 FE) MG tablet Take 325 mg by mouth  daily with breakfast.    Historical Provider, MD  hydrochlorothiazide (HYDRODIURIL) 25 MG tablet Take 25 mg by mouth daily.      Historical Provider, MD  ibuprofen (ADVIL,MOTRIN) 800 MG tablet Take 1 tablet (800 mg total) by mouth 3 (three) times daily. 07/19/15   Leo Grosser, MD  traMADol (ULTRAM) 50 MG tablet Take 1 tablet (50 mg total) by mouth every 6 (six) hours as needed. Patient not taking: Reported on 07/19/2015 08/24/14   Gregor Hams, MD   BP 141/76 mmHg  Pulse 97  Temp(Src) 98.5 F (36.9 C) (Oral)  Resp 18  Ht 5\' 7"  (1.702 m)  Wt 240 lb (108.863 kg)  BMI 37.58 kg/m2  SpO2 100%  LMP 06/11/2015 (Approximate) Physical Exam  Constitutional: She is oriented to person, place, and time. She appears well-developed and well-nourished. No distress.  HENT:  Head: Normocephalic.  Eyes: Conjunctivae are normal.  Neck: Neck supple. No tracheal deviation present.  Cardiovascular: Normal rate and regular rhythm.   Pulmonary/Chest: Effort normal. No respiratory distress.  Abdominal: Soft. She exhibits no distension.  Musculoskeletal:       Left knee: She exhibits effusion (Small). She exhibits normal range of motion, no swelling, no ecchymosis, no deformity, normal alignment, no LCL laxity, normal patellar mobility and no MCL laxity. Tenderness (diffuse) found.  Neurological: She is alert and  oriented to person, place, and time.  Skin: Skin is warm and dry.  Psychiatric: She has a normal mood and affect.    ED Course  Procedures (including critical care time) Labs Review Labs Reviewed - No data to display  Imaging Review Dg Knee Complete 4 Views Left  07/19/2015   CLINICAL DATA:  Left knee pain for 2 months worsening today.  EXAM: LEFT KNEE - COMPLETE 4+ VIEW  COMPARISON:  None.  FINDINGS: Spurring of the tibial spine. Mild medial compartmental articular space narrowing. Probable knee effusion.  IMPRESSION: 1. Mild degenerative findings including mild medial compartmental articular  space narrowing. 2. Suspected knee effusion.   Electronically Signed   By: Van Clines M.D.   On: 07/19/2015 12:35   I have personally reviewed and evaluated these images and lab results as part of my medical decision-making.   EKG Interpretation None      MDM   Final diagnoses:  Osteoarthritis of left knee, unspecified osteoarthritis type    44 year old feel presents with left knee pain over the last 2 months intermittently worse with activity. She has no acute tendon deficits to confrontation, has full range of motion and is able to ambulate appropriately. I suspect with the prolonged gradual worsening of her symptoms that this is related to arthritis and plain film shows no evidence of fracture or misalignment of joint. No swelling, erythema, fevers or other signs of infection currently. Plan to follow up with PCP as needed and return precautions discussed for worsening or new concerning symptoms.   Leo Grosser, MD 07/19/15 1250

## 2015-07-19 NOTE — ED Notes (Signed)
PT placed in gown and in bed. Pt monitored by pulse ox and bp cuff. 

## 2015-07-19 NOTE — Discharge Instructions (Signed)
Arthritis, Nonspecific °Arthritis is inflammation of a joint. This usually means pain, redness, warmth or swelling are present. One or more joints may be involved. There are a number of types of arthritis. Your caregiver may not be able to tell what type of arthritis you have right away. °CAUSES  °The most common cause of arthritis is the wear and tear on the joint (osteoarthritis). This causes damage to the cartilage, which can break down over time. The knees, hips, back and neck are most often affected by this type of arthritis. °Other types of arthritis and common causes of joint pain include: °· Sprains and other injuries near the joint. Sometimes minor sprains and injuries cause pain and swelling that develop hours later. °· Rheumatoid arthritis. This affects hands, feet and knees. It usually affects both sides of your body at the same time. It is often associated with chronic ailments, fever, weight loss and general weakness. °· Crystal arthritis. Gout and pseudo gout can cause occasional acute severe pain, redness and swelling in the foot, ankle, or knee. °· Infectious arthritis. Bacteria can get into a joint through a break in overlying skin. This can cause infection of the joint. Bacteria and viruses can also spread through the blood and affect your joints. °· Drug, infectious and allergy reactions. Sometimes joints can become mildly painful and slightly swollen with these types of illnesses. °SYMPTOMS  °· Pain is the main symptom. °· Your joint or joints can also be red, swollen and warm or hot to the touch. °· You may have a fever with certain types of arthritis, or even feel overall ill. °· The joint with arthritis will hurt with movement. Stiffness is present with some types of arthritis. °DIAGNOSIS  °Your caregiver will suspect arthritis based on your description of your symptoms and on your exam. Testing may be needed to find the type of arthritis: °· Blood and sometimes urine tests. °· X-ray tests  and sometimes CT or MRI scans. °· Removal of fluid from the joint (arthrocentesis) is done to check for bacteria, crystals or other causes. Your caregiver (or a specialist) will numb the area over the joint with a local anesthetic, and use a needle to remove joint fluid for examination. This procedure is only minimally uncomfortable. °· Even with these tests, your caregiver may not be able to tell what kind of arthritis you have. Consultation with a specialist (rheumatologist) may be helpful. °TREATMENT  °Your caregiver will discuss with you treatment specific to your type of arthritis. If the specific type cannot be determined, then the following general recommendations may apply. °Treatment of severe joint pain includes: °· Rest. °· Elevation. °· Anti-inflammatory medication (for example, ibuprofen) may be prescribed. Avoiding activities that cause increased pain. °· Only take over-the-counter or prescription medicines for pain and discomfort as recommended by your caregiver. °· Cold packs over an inflamed joint may be used for 10 to 15 minutes every hour. Hot packs sometimes feel better, but do not use overnight. Do not use hot packs if you are diabetic without your caregiver's permission. °· A cortisone shot into arthritic joints may help reduce pain and swelling. °· Any acute arthritis that gets worse over the next 1 to 2 days needs to be looked at to be sure there is no joint infection. °Long-term arthritis treatment involves modifying activities and lifestyle to reduce joint stress jarring. This can include weight loss. Also, exercise is needed to nourish the joint cartilage and remove waste. This helps keep the muscles   around the joint strong. HOME CARE INSTRUCTIONS   Do not take aspirin to relieve pain if gout is suspected. This elevates uric acid levels.  Only take over-the-counter or prescription medicines for pain, discomfort or fever as directed by your caregiver.  Rest the joint as much as  possible.  If your joint is swollen, keep it elevated.  Use crutches if the painful joint is in your leg.  Drinking plenty of fluids may help for certain types of arthritis.  Follow your caregiver's dietary instructions.  Try low-impact exercise such as:  Swimming.  Water aerobics.  Biking.  Walking.  Morning stiffness is often relieved by a warm shower.  Put your joints through regular range-of-motion. SEEK MEDICAL CARE IF:   You do not feel better in 24 hours or are getting worse.  You have side effects to medications, or are not getting better with treatment. SEEK IMMEDIATE MEDICAL CARE IF:   You have a fever.  You develop severe joint pain, swelling or redness.  Many joints are involved and become painful and swollen.  There is severe back pain and/or leg weakness.  You have loss of bowel or bladder control. Document Released: 11/20/2004 Document Revised: 01/05/2012 Document Reviewed: 12/06/2008 [facility] Patient Information 2015 [place], [place]. This information is not intended to replace advice given to you by your health care provider. Make sure you discuss any questions you have with your health care provider. RICE: Routine Care for Injuries The routine care of many injuries includes Rest, Ice, Compression, and Elevation (RICE). HOME CARE INSTRUCTIONS  Rest is needed to allow your body to heal. Routine activities can usually be resumed when comfortable. Injured tendons and bones can take up to 6 weeks to heal. Tendons are the cord-like structures that attach muscle to bone.  Ice following an injury helps keep the swelling down and reduces pain.  Put ice in a plastic bag.  Place a towel between your skin and the bag.  Leave the ice on for 15-20 minutes, 3-4 times a day, or as directed by your health care provider. Do this while awake, for the first 24 to 48 hours. After that, continue as directed by your caregiver.  Compression helps keep swelling down. It  also gives support and helps with discomfort. If an elastic bandage has been applied, it should be removed and reapplied every 3 to 4 hours. It should not be applied tightly, but firmly enough to keep swelling down. Watch fingers or toes for swelling, bluish discoloration, coldness, numbness, or excessive pain. If any of these problems occur, remove the bandage and reapply loosely. Contact your caregiver if these problems continue.  Elevation helps reduce swelling and decreases pain. With extremities, such as the arms, hands, legs, and feet, the injured area should be placed near or above the level of the heart, if possible. SEEK IMMEDIATE MEDICAL CARE IF:  You have persistent pain and swelling.  You develop redness, numbness, or unexpected weakness.  Your symptoms are getting worse rather than improving after several days. These symptoms may indicate that further evaluation or further X-rays are needed. Sometimes, X-rays may not show a small broken bone (fracture) until 1 week or 10 days later. Make a follow-up appointment with your caregiver. Ask when your X-ray results will be ready. Make sure you get your X-ray results. Document Released: 01/25/2001 Document Revised: 10/18/2013 Document Reviewed: 03/14/2011 [facility] Patient Information 2015 [place], [place]. This information is not intended to replace advice given to you by your health care  provider. Make sure you discuss any questions you have with your health care provider. ° °

## 2016-06-29 ENCOUNTER — Emergency Department: Payer: 59

## 2016-06-29 ENCOUNTER — Emergency Department
Admission: EM | Admit: 2016-06-29 | Discharge: 2016-06-29 | Disposition: A | Discharge status: Home / Self Care | Payer: 59 | Attending: Emergency Medicine | Admitting: Emergency Medicine

## 2016-06-29 DIAGNOSIS — S46812A Strain of other muscles, fascia and tendons at shoulder and upper arm level, left arm, initial encounter: Secondary | ICD-10-CM | POA: Insufficient documentation

## 2016-06-29 DIAGNOSIS — X500XXA Overexertion from strenuous movement or load, initial encounter: Secondary | ICD-10-CM | POA: Insufficient documentation

## 2016-06-29 MED ORDER — IBUPROFEN 600 MG PO TABS
600.0000 mg | ORAL_TABLET | Freq: Once | ORAL | Status: AC
Start: 2016-06-29 — End: 2016-06-29
  Administered 2016-06-29: 600 mg via ORAL
  Filled 2016-06-29: qty 1

## 2016-06-29 MED ORDER — DIAZEPAM 5 MG PO TABS
5.0000 mg | ORAL_TABLET | Freq: Three times a day (TID) | ORAL | Status: AC | PRN
Start: 2016-06-29 — End: ?

## 2016-06-29 MED ORDER — IBUPROFEN 600 MG PO TABS
600.0000 mg | ORAL_TABLET | Freq: Three times a day (TID) | ORAL | Status: AC | PRN
Start: 2016-06-29 — End: 2016-07-06

## 2016-06-29 NOTE — Discharge Instructions (Signed)
Muscle Strain, General    You have been diagnosed with a muscle strain.    Any muscle in the body can be strained. A strain is an injury to muscles where some muscle fibers are injured by being stretched or partly torn. This usually happens from using the muscle too much or from doing an activity the muscle is not used to.    Some strain symptoms are pain, muscle cramping and soreness to the touch.    Often, muscle pain and stiffness are worse the next day. This is much like what happens when someone starts exercising for the first time. After exercising, the person may feel pretty good. However, the next day all the exercised muscles are stiff and sore.    General strain treatment includes:   Resting the affected part.   Pain medicine.   Muscle relaxant medicines.   Warm compresses (such as a warm, moist towel).   Gently stretching the injured muscle.   When tolerated, gently massaging the injured area.    This injury is self-limited (it gets better on its own). It rarely needs specific treatment.    YOU SHOULD SEEK MEDICAL ATTENTION IMMEDIATELY, EITHER HERE OR AT THE NEAREST EMERGENCY DEPARTMENT, IF ANY OF THE FOLLOWING OCCURS:   Major increase in swelling of the affected area.   Pain gets worse instead of gradually improving.   Skin gets red over the affected area.   Unable to use the affected limb. Limb weakness or numbness.

## 2016-06-29 NOTE — ED Provider Notes (Signed)
EMERGENCY DEPARTMENT HISTORY AND PHYSICAL EXAM     Physician/Midlevel provider first contact with patient: 06/29/16 1120         Date: 06/29/2016  Patient Name: Emma Neal    History of Presenting Illness     Chief Complaint   Patient presents with   . Shoulder Pain       History Provided By: Patient    Chief Complaint: left shoulder pain  Onset: 4 days  Timing: Gradual  Location: left lower shoulder   Quality: Achy   Severity: Moderate  Modifying Factors:   Associated Symptoms: worse with movement     Additional History: Emma Neal is a 45 y.o. female presenting to the ED with continued left shoulder pain worse with movement, states started Friday when patient was pulling heavy detergent off the top shelves, states has tried multiple meds without relief, denies numbness or tingling, no weakness. States worse with movement, denies chest pain or SOB. Well appearing.     PCP: Pcp, [place], MD      No current facility-administered medications for this encounter.     Current Outpatient Prescriptions   Medication Sig Dispense Refill   . diazePAM (VALIUM) 5 MG tablet Take 1 tablet (5 mg total) by mouth every 8 (eight) hours as needed (muscle spasm). 12 tablet 0   . ibuprofen (ADVIL,MOTRIN) 600 MG tablet Take 1 tablet (600 mg total) by mouth every 8 (eight) hours as needed for Pain. 21 tablet 0       Past History     Past Medical History:  Past Medical History   Diagnosis Date   . No known health problems        Past Surgical History:  Past Surgical History   Procedure Laterality Date   . Abscess drainage         Family History:  History reviewed. No pertinent family history.    Social History:  Social History   Substance Use Topics   . Smoking status: Never Smoker    . Smokeless tobacco: None   . Alcohol Use: Yes      Comment: ocassionally       Allergies:  No Known Allergies    Review of Systems     Review of Systems   Constitutional: Negative for fever, chills and malaise/fatigue.   HENT: Negative for congestion,  ear pain and sore throat.    Respiratory: Negative for cough, sputum production, shortness of breath and wheezing.    Cardiovascular: Negative for chest pain, palpitations, claudication and leg swelling.   Musculoskeletal: Positive for myalgias. Negative for back pain, joint pain, falls and neck pain.   Skin: Negative for itching and rash.   Neurological: Negative for tingling, sensory change, weakness and headaches.       Physical Exam   BP 153/77 mmHg  Pulse 70  Temp(Src) 98.8 F (37.1 C) (Oral)  Resp 16  Ht 5\' 7"  (1.702 m)  Wt 72.576 kg  BMI 25.05 kg/m2  SpO2 100%  LMP 06/10/2016    Physical Exam   Constitutional: She is oriented to person, place, and time and well-developed, well-nourished, and in no distress. No distress.   HENT:   Head: Normocephalic and atraumatic.   Cardiovascular: Normal rate, regular rhythm, normal heart sounds and intact distal pulses.    Pulmonary/Chest: Effort normal and breath sounds normal. No respiratory distress. She exhibits no tenderness.   Musculoskeletal:        Cervical back: She exhibits tenderness, pain and spasm.  She exhibits normal range of motion, no bony tenderness, no swelling and no edema.        Back:    Neurological: She is alert and oriented to person, place, and time. Gait normal. GCS score is 15.   Skin: Skin is warm and dry. No rash noted. No erythema. No pallor.   Nursing note and vitals reviewed.      Diagnostic Study Results     Labs -     Results     ** No results found for the last 24 hours. **          Radiologic Studies -   Radiology Results (24 Hour)     ** No results found for the last 24 hours. **      .    Medical Decision Making   I am the first provider for this patient.    I reviewed the vital signs, available nursing notes, past medical history, past surgical history, family history and social history.    Vital Signs-Reviewed the patient's vital signs.     Patient Vitals for the past 12 hrs:   BP Temp Pulse Resp   06/29/16 1034 153/77 mmHg  98.8 F (37.1 C) 70 16       Pulse Oximetry Analysis - Normal 100% on ra    Procedures:    Core Measures:    Critical Care Time:     Old Medical Records: Nursing notes.     ED Course:     Provider Notes: trapezius strain, reproducible MSK tenderness, BUE NVI with 5/5 strength, pain meds given to patient with rx for home care. Verbalized agreement and understanding. Follow up with pcp in 2 days. Return precautions advised.     Diagnosis     Clinical Impression:   1. Trapezius muscle strain, left, initial encounter        Treatment Plan:   ED Disposition     Discharge Emma Neal discharge to home/self care.    Condition at disposition: Stable                    Heriberto [place], [place]  06/29/16 1911

## 2016-07-14 ENCOUNTER — Encounter (HOSPITAL_COMMUNITY): Payer: Self-pay | Admitting: Emergency Medicine

## 2016-07-14 ENCOUNTER — Emergency Department (HOSPITAL_COMMUNITY)
Admission: EM | Admit: 2016-07-14 | Discharge: 2016-07-14 | Disposition: A | Discharge status: Home / Self Care | Payer: BC Managed Care – PPO | Attending: Emergency Medicine | Admitting: Emergency Medicine

## 2016-07-14 DIAGNOSIS — R05 Cough: Secondary | ICD-10-CM | POA: Diagnosis present

## 2016-07-14 DIAGNOSIS — Z79899 Other long term (current) drug therapy: Secondary | ICD-10-CM | POA: Diagnosis not present

## 2016-07-14 DIAGNOSIS — J069 Acute upper respiratory infection, unspecified: Secondary | ICD-10-CM | POA: Insufficient documentation

## 2016-07-14 DIAGNOSIS — I10 Essential (primary) hypertension: Secondary | ICD-10-CM | POA: Insufficient documentation

## 2016-07-14 LAB — RAPID STREP SCREEN (MED CTR MEBANE ONLY): STREPTOCOCCUS, GROUP A SCREEN (DIRECT): NEGATIVE

## 2016-07-14 MED ORDER — DEXAMETHASONE 1 MG/ML PO CONC
10.0000 mg | Freq: Once | ORAL | Status: AC
  Administered 2016-07-14: 10 mg via ORAL
  Filled 2016-07-14: qty 10

## 2016-07-14 MED ORDER — IBUPROFEN 200 MG PO TABS
600.0000 mg | ORAL_TABLET | Freq: Once | ORAL | Status: AC
  Administered 2016-07-14: 600 mg via ORAL
  Filled 2016-07-14: qty 1

## 2016-07-14 NOTE — ED Provider Notes (Signed)
[facility] DEPT Provider Note   CSN: [id number] Arrival date & time: 07/14/16  [id number]    By signing my name below, I, Sonum Patel, attest that this documentation has been prepared under the direction and in the presence of [employer], PA-C. Electronically Signed: Ludger Nutting, Scribe. 07/14/16. 10:46 AM.  History   Chief Complaint Chief Complaint  Patient presents with  . URI  . Sore Throat    The history is provided by the patient. No language interpreter was used.     HPI Comments: Martha Davis is a 45 y.o. female who presents to the Emergency Department complaining of gradual onset, constant, gradually worsened generalized myalgias with associated chills, subjective fever, sore throat, nasal congestion/rhinorrhea, and mild cough that began 3 days ago. She has taken Tylenol PM and Alka-Seltzer with minimal relief. 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 She reports history of strep in the past. She denies CP, SOB, wheezing, abdominal pain, nausea, vomiting.   Past Medical History:  Diagnosis Date  . Achalasia and cardiospasm   . Anemia   . Chest pain, unspecified   . 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 Dysphagia, unspecified(787.20)   . Hypertrophy of adenoids alone   . Unspecified essential hypertension     Patient Active Problem List   Diagnosis Date Noted  . Fibroids 02/12/2012  . Menometrorrhagia 02/12/2012  . Anemia 02/12/2012  . HTN (hypertension) 10/01/2011    History reviewed. No pertinent surgical history.  OB History    Gravida Para Term Preterm AB Living   1 1 1     1    SAB TAB Ectopic Multiple Live Births                   Home Medications    Prior to Admission medications   Medication Sig Start Date End Date Taking? Authorizing Provider  ferrous sulfate 325 (65 FE) MG tablet Take 325 mg by mouth daily with breakfast.    Historical  Provider, MD  hydrochlorothiazide (HYDRODIURIL) 25 MG tablet Take 25 mg by mouth daily.      Historical Provider, MD  ibuprofen (ADVIL,MOTRIN) 800 MG tablet Take 1 tablet (800 mg total) by mouth 3 (three) times daily. 07/19/15   Leo Grosser, MD  traMADol (ULTRAM) 50 MG tablet Take 1 tablet (50 mg total) by mouth every 6 (six) hours as needed. Patient not taking: Reported on 07/19/2015 08/24/14   Gregor Hams, MD    Family History Family History  Problem Relation Age of Onset  . Diabetes Father   . Hypertension Mother   . Stroke Mother     Social History Social History  Substance Use Topics  . Smoking status: Never Smoker  . Smokeless tobacco: Never Used  . Alcohol use No     Allergies   Review of patient's allergies indicates no known allergies.   Review of Systems Review of Systems  Constitutional: Positive for chills and fever (subjective).  HENT: Positive for congestion and sore throat.   Respiratory: Positive for cough. Negative for shortness of breath and wheezing.   Cardiovascular: Negative for chest pain.  Gastrointestinal: Negative for abdominal pain, nausea and vomiting.  Musculoskeletal: Positive for myalgias.     Physical Exam Updated Vital Signs BP 140/89 (BP Location: Left Arm)   Pulse 84   Temp 98.3 F (36.8 C) (Oral)   Resp 20   Ht  5\' 7"  (1.702 m)   Wt 242 lb (109.8 kg)   LMP 06/15/2016   SpO2 98%   BMI 37.90 kg/m   Physical Exam  Constitutional: She is oriented to person, place, and time. She appears well-developed and well-nourished.  Looks ill but non-toxic  HENT:  Head: Normocephalic and atraumatic.  Right Ear: Hearing, tympanic membrane, external ear and ear canal normal.  Left Ear: Hearing, tympanic membrane, external ear and ear canal normal.  Nose: Mucosal edema present. No rhinorrhea.  Mouth/Throat: Uvula is midline and mucous membranes are normal. Posterior oropharyngeal erythema present. No oropharyngeal exudate, posterior  oropharyngeal edema or tonsillar abscesses.  Cardiovascular: Normal rate and regular rhythm.  Exam reveals no gallop and no friction rub.   No murmur heard. Pulmonary/Chest: Effort normal and breath sounds normal. No respiratory distress. She has no wheezes. She has no rales. She exhibits no tenderness.  Neurological: She is alert and oriented to person, place, and time.  Skin: Skin is warm and dry.  Psychiatric: She has a normal mood and affect.  Nursing note and vitals reviewed.    ED Treatments / Results  DIAGNOSTIC STUDIES: Oxygen Saturation is 98% on RA, normal by my interpretation.    COORDINATION OF CARE: 10:45 AM Discussed treatment plan with pt at bedside and pt agreed to plan.   Labs (all labs ordered are listed, but only abnormal results are displayed) Labs Reviewed  RAPID STREP SCREEN (NOT AT [facility])  CULTURE, GROUP A STREP [facility])    EKG  EKG Interpretation None       Radiology No results found.  Procedures Procedures (including critical care time)  Medications Ordered in ED Medications  dexamethasone (DECADRON) 1 MG/ML solution 10 mg (10 mg Oral Given 07/14/16 1111)  ibuprofen (ADVIL,MOTRIN) tablet 600 mg (600 mg Oral Given 07/14/16 1111)     Initial Impression / Assessment and Plan / ED Course  I have reviewed the triage vital signs and the nursing notes.  Pertinent labs & imaging results that were available during my care of the patient were reviewed by me and considered in my medical decision making (see chart for details).  Clinical Course    Pt symptoms consistent with URI. Patient is afebrile, not tachycardic or tachypneic, normotensive, and not hypoxic. Dose of decadron given in ED as well as Ibuprofen. Pt will be discharged with symptomatic treatment.  Discussed return precautions.  Pt is hemodynamically stable & in NAD prior to discharge.   Final Clinical Impressions(s) / ED Diagnoses   Final diagnoses:  URI (upper respiratory infection)     New Prescriptions New Prescriptions   No medications on file   I personally performed the services described in this documentation, which was scribed in my presence. The recorded information has been reviewed and is accurate.    Recardo Evangelist, PA-C 07/15/16 1805    Carmin Muskrat, MD 07/17/16 [phone]

## 2016-07-14 NOTE — ED Triage Notes (Signed)
Pt states starting having a sore throat and cold symptoms on Friday-- feels weak, tired.

## 2016-07-16 LAB — CULTURE, GROUP A STREP (THRC)

## 2016-07-17 ENCOUNTER — Ambulatory Visit (HOSPITAL_COMMUNITY)
Admission: EM | Admit: 2016-07-17 | Discharge: 2016-07-17 | Disposition: A | Discharge status: Home / Self Care | Payer: BC Managed Care – PPO | Attending: Internal Medicine | Admitting: Internal Medicine

## 2016-07-17 ENCOUNTER — Encounter (HOSPITAL_COMMUNITY): Payer: Self-pay | Admitting: *Deleted

## 2016-07-17 DIAGNOSIS — J069 Acute upper respiratory infection, unspecified: Secondary | ICD-10-CM

## 2016-07-17 MED ORDER — AZITHROMYCIN 250 MG PO TABS: 250.0000 mg | ORAL_TABLET | Freq: Every day | ORAL | 0 refills | Status: DC

## 2016-07-17 MED ORDER — GUAIFENESIN 100 MG/5ML PO LIQD: 200.0000 mg | ORAL | 0 refills | Status: DC | PRN

## 2016-07-17 NOTE — Discharge Instructions (Signed)
°  You may take 400-600mg  Ibuprofen (Motrin) every 6-8 hours for fever and pain  Alternate with Tylenol  You may take 500mg  Tylenol every 4-6 hours as needed for fever and pain  Follow-up with your primary care provider next week for recheck of symptoms if not improving.  Be sure to drink plenty of fluids and rest, at least 8hrs of sleep a night, preferably more while you are sick. Return urgent care or go to closest ER if you cannot keep down fluids/signs of dehydration, fever not reducing with Tylenol, difficulty breathing/wheezing, stiff neck, worsening condition, or other concerns (see below)   Your symptoms are likely due to a virus such as the common cold, however, if you developing worsening chest congestion with shortness of breath, persistent fever for 3 days, or symptoms not improving in 4-5 days, you may fill the antibiotic (azithromycin).  If you do fill the antibiotic,  please take antibiotics as prescribed and be sure to complete entire course even if you start to feel better to ensure infection does not come back.

## 2016-07-17 NOTE — ED Provider Notes (Signed)
CSN: [id number]     Arrival date & time 07/17/16  1052 History   First MD Initiated Contact with Patient 07/17/16 1114     Chief Complaint  Patient presents with  . Cough   (Consider location/radiation/quality/duration/timing/severity/associated sxs/prior Treatment) HPI  Martha Davis is a 45 y.o. female presenting to UC with c/o cough that started about 6 days ago, mild to moderately productive with body aches, chills, and nasal congestion.  She was seen in ED on 07/14/16 for same, dx with a viral URI, given ibuprofen and decadron while in the ED. She has since been taking Coricidin but no tylenol or motrin for the body aches.  Pt states she has not gotten any better since being in the ED. Mild chest soreness from coughing.  Denies n/v/d. Pt notes she has been out of work all week due to feeling sick. 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    Past Medical History:  Diagnosis Date  . Achalasia and cardiospasm   . Anemia   . Chest pain, unspecified   . 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 Dysphagia, unspecified(787.20)   . Hypertrophy of adenoids alone   . Unspecified essential hypertension    History reviewed. No pertinent surgical history. Family History  Problem Relation Age of Onset  . Diabetes Father   . Hypertension Mother   . Stroke Mother    Social History  Substance Use Topics  . Smoking status: Never Smoker  . Smokeless tobacco: Never Used  . Alcohol use No   OB History    Gravida Para Term Preterm AB Living   1 1 1     1    SAB TAB Ectopic Multiple Live Births                 Review of Systems  Constitutional: Positive for chills, fatigue and fever.  HENT: Positive for congestion, rhinorrhea, sneezing and sore throat. Negative for ear pain, trouble swallowing and voice change.   Respiratory: Positive for cough. Negative for shortness of breath.   Cardiovascular: Negative for chest pain and  palpitations.  Gastrointestinal: Negative for abdominal pain, diarrhea, nausea and vomiting.  Musculoskeletal: Positive for arthralgias and myalgias. Negative for back pain.       Body aches  Skin: Negative for rash.  Neurological: Negative for dizziness, light-headedness and headaches.    Allergies  Review of patient's allergies indicates no known allergies.  Home Medications   Prior to Admission medications   Medication Sig Start Date End Date Taking? Authorizing Provider  azithromycin (ZITHROMAX) 250 MG tablet Take 1 tablet (250 mg total) by mouth daily. Take first 2 tablets together, then 1 every day until finished. 07/17/16   Noland Fordyce, PA-C  ferrous sulfate 325 (65 FE) MG tablet Take 325 mg by mouth daily with breakfast.    Historical Provider, MD  guaiFENesin (ROBITUSSIN) 100 MG/5ML liquid Take 10-20 mLs (200-400 mg total) by mouth every 4 (four) hours as needed for cough. 07/17/16   Noland Fordyce, PA-C  hydrochlorothiazide (HYDRODIURIL) 25 MG tablet Take 25 mg by mouth daily.      Historical Provider, MD  ibuprofen (ADVIL,MOTRIN) 800 MG tablet Take 1 tablet (800 mg total) by mouth 3 (three) times daily. 07/19/15   Leo Grosser, MD   Meds Ordered and Administered this Visit  Medications - No data to display  BP 123/79 (BP Location: Right Arm)   Pulse 91   Temp  99.1 F (37.3 C) (Oral)   Resp 20   LMP 06/15/2016   SpO2 99%  No data found.   Physical Exam  Constitutional: She appears well-developed and well-nourished. No distress.  HENT:  Head: Normocephalic and atraumatic.  Right Ear: Tympanic membrane normal.  Left Ear: Tympanic membrane normal.  Nose: Mucosal edema present. Right sinus exhibits no maxillary sinus tenderness and no frontal sinus tenderness. Left sinus exhibits no maxillary sinus tenderness and no frontal sinus tenderness.  Mouth/Throat: Uvula is midline, oropharynx is clear and moist and mucous membranes are normal.  Eyes: Conjunctivae are normal. No  scleral icterus.  Neck: Normal range of motion. Neck supple.  Cardiovascular: Normal rate, regular rhythm and normal heart sounds.   Pulmonary/Chest: Effort normal and breath sounds normal. No stridor. No respiratory distress. She has no wheezes. She has no rales.  Abdominal: Soft. She exhibits no distension. There is no tenderness.  Musculoskeletal: Normal range of motion.  Lymphadenopathy:    She has no cervical adenopathy.  Neurological: She is alert.  Skin: Skin is warm and dry. She is not diaphoretic.  Nursing note and vitals reviewed.   Urgent Care Course   Clinical Course    Procedures (including critical care time)  Labs Review Labs Reviewed - No data to display  Imaging Review No results found.    MDM   1. URI (upper respiratory infection)    Pt c/o 6 days of URI symptoms. 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  Temp of 99*F in UC.  Lungs: CTAB Symptoms could still be viral at this stage, encouraged symptomatic treatment by alternating acetaminophen and ibuprofen for body aches and fever. Will also prescribed Guaifenesin for cough. Prescription to hold for Azithromycin with expiration date.  Advised to fill if fever persists for 3 days, or symptoms not improving in 4-5 days. Encouraged fluids and rest.  F/u with PCP in 7-10 days if not improving. Patient verbalized understanding and agreement with treatment plan.     Noland Fordyce, PA-C 07/17/16 1148

## 2016-07-17 NOTE — ED Triage Notes (Signed)
Patient reports cough started on Friday, went to ED on Monday and was diagnosed with URI, patient given tylenol and one time dose of steroid, patient reports no relief. Mild fever noted in triage. Patient with barking cough.

## 2016-08-26 ENCOUNTER — Emergency Department (HOSPITAL_COMMUNITY)
Admission: EM | Admit: 2016-08-26 | Discharge: 2016-08-26 | Disposition: A | Discharge status: Home / Self Care | Payer: BC Managed Care – PPO | Attending: Emergency Medicine | Admitting: Emergency Medicine

## 2016-08-26 ENCOUNTER — Encounter (HOSPITAL_COMMUNITY): Payer: Self-pay | Admitting: Emergency Medicine

## 2016-08-26 DIAGNOSIS — I10 Essential (primary) hypertension: Secondary | ICD-10-CM | POA: Diagnosis not present

## 2016-08-26 DIAGNOSIS — R070 Pain in throat: Secondary | ICD-10-CM | POA: Diagnosis not present

## 2016-08-26 MED ORDER — RANITIDINE HCL 150 MG/10ML PO SYRP
150.0000 mg | ORAL_SOLUTION | Freq: Once | ORAL | Status: AC
  Administered 2016-08-26: 150 mg via ORAL
  Filled 2016-08-26: qty 10

## 2016-08-26 MED ORDER — RANITIDINE HCL 150 MG PO CAPS: 150.0000 mg | ORAL_CAPSULE | Freq: Every day | ORAL | 0 refills | Status: DC

## 2016-08-26 MED ORDER — GI COCKTAIL ~~LOC~~
30.0000 mL | Freq: Once | ORAL | Status: AC
  Administered 2016-08-26: 30 mL via ORAL
  Filled 2016-08-26: qty 30

## 2016-08-26 NOTE — Discharge Instructions (Signed)
Please take your medications as prescribed. Follow-up with your doctor for further evaluation and management of your symptoms. Return to ED for new or worsening symptoms as we discussed.

## 2016-08-26 NOTE — ED Triage Notes (Signed)
Patient states she cannot swallow food and has been vomiting periodically since eating a bite of hot salmon last night around 1900. States the food was directly from the oven and she was unable to swallow it or regurgitate it. Currently unable to tolerate thin liquids without vomiting. Endorses history of GERD with similar occurrence of symptoms in the past.

## 2016-08-26 NOTE — ED Provider Notes (Signed)
[facility] DEPT Provider Note   CSN: [id number] Arrival date & time: 08/26/16  0423     History   Chief Complaint Chief Complaint  Patient presents with  . Airway Obstruction    Potential    HPI Martha Davis is a 45 y.o. female.  HPI here for evaluation of burning discomfort in her throat. Patient reports last night around 7 PM, she tried a piece of salmon that she just took out of the oven. She reports it burned the back of her mouth and since then she has had discomfort with swallowing. She reports symptoms are similar to when she has had reflux in the past. Also reports 3 episodes of nonbloody and nonbilious emesis last night, none today. She has not tried anything to improve her symptoms. No fevers, chills, chest pain, shortness of breath, abdominal pain. She does not take any medications for reflux.  Past Medical History:  Diagnosis Date  . Achalasia and cardiospasm   . Anemia   . Chest pain, unspecified   . 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 Dysphagia, unspecified(787.20)   . Hypertrophy of adenoids alone   . Unspecified essential hypertension     Patient Active Problem List   Diagnosis Date Noted  . Fibroids 02/12/2012  . Menometrorrhagia 02/12/2012  . Anemia 02/12/2012  . HTN (hypertension) 10/01/2011    History reviewed. No pertinent surgical history.  OB History    Gravida Para Term Preterm AB Living   1 1 1     1    SAB TAB Ectopic Multiple Live Births                   Home Medications    Prior to Admission medications   Medication Sig Start Date End Date Taking? Authorizing Provider  azithromycin (ZITHROMAX) 250 MG tablet Take 1 tablet (250 mg total) by mouth daily. Take first 2 tablets together, then 1 every day until finished. 07/17/16   Noland Fordyce, PA-C  ferrous sulfate 325 (65 FE) MG tablet Take 325 mg by mouth daily with breakfast.    Historical Provider, MD  guaiFENesin (ROBITUSSIN) 100 MG/5ML liquid  Take 10-20 mLs (200-400 mg total) by mouth every 4 (four) hours as needed for cough. 07/17/16   Noland Fordyce, PA-C  hydrochlorothiazide (HYDRODIURIL) 25 MG tablet Take 25 mg by mouth daily.      Historical Provider, MD  ibuprofen (ADVIL,MOTRIN) 800 MG tablet Take 1 tablet (800 mg total) by mouth 3 (three) times daily. 07/19/15   Leo Grosser, MD  ranitidine (ZANTAC) 150 MG capsule Take 1 capsule (150 mg total) by mouth daily. 08/26/16   Comer Locket, PA-C    Family History Family History  Problem Relation Age of Onset  . Diabetes Father   . Hypertension Mother   . Stroke Mother     Social History Social History  Substance Use Topics  . Smoking status: Never Smoker  . Smokeless tobacco: Never Used  . Alcohol use No     Allergies   Review of patient's allergies indicates no known allergies.   Review of Systems Review of Systems A 10 point review of systems was completed and was negative except for pertinent positives and negatives as mentioned in the history of present illness    Physical Exam Updated Vital Signs BP 118/80 (BP Location: Right Arm)   Pulse 73   Temp 98.4 F (36.9 C) (Oral)   Resp 17   Ht 5\' 7"  (1.702  m)   Wt 112.5 kg   LMP 08/10/2016 (Exact Date)   SpO2 99%   BMI 38.86 kg/m   Physical Exam  Constitutional: She appears well-developed. No distress.  Awake, alert and nontoxic in appearance. Appears very comfortable, no apparent distress.  HENT:  Head: Normocephalic and atraumatic.  Right Ear: External ear normal.  Left Ear: External ear normal.  Mouth/Throat: Oropharynx is clear and moist.  Eyes: Conjunctivae and EOM are normal. Pupils are equal, round, and reactive to light.  Neck: Normal range of motion. Neck supple. No JVD present.  Cardiovascular: Normal rate, regular rhythm and normal heart sounds.   Pulmonary/Chest: Effort normal and breath sounds normal. No stridor. No respiratory distress. She has no wheezes. She has no rales.    Abdominal: Soft. There is no tenderness.  Musculoskeletal: Normal range of motion.  Neurological:  Awake, alert, cooperative and aware of situation; motor strength bilaterally; sensation normal to light touch bilaterally; no facial asymmetry; tongue midline; major cranial nerves appear intact;  baseline gait without new ataxia.  Skin: No rash noted. She is not diaphoretic.  Psychiatric: She has a normal mood and affect. Her behavior is normal. Thought content normal.  Nursing note and vitals reviewed.    ED Treatments / Results  Labs (all labs ordered are listed, but only abnormal results are displayed) Labs Reviewed - No data to display  EKG  EKG Interpretation None       Radiology No results found.  Procedures Procedures (including critical care time)  Medications Ordered in ED Medications  gi cocktail (Maalox,Lidocaine,Donnatal) (30 mLs Oral Given 08/26/16 0617)     Initial Impression / Assessment and Plan / ED Course  I have reviewed the triage vital signs and the nursing notes.  Pertinent labs & imaging results that were available during my care of the patient were reviewed by me and considered in my medical decision making (see chart for details).  Clinical Course    Patient presents with throat discomfort secondary to eating a hot piece of salmon. Her exam is very reassuring. Given GI cocktail in the emergency department with some relief. Plan to start on ranitidine. Discussed strict return precautions.Follow-up with PCP in the next week for reevaluation.  Final Clinical Impressions(s) / ED Diagnoses   Final diagnoses:  Throat discomfort    New Prescriptions New Prescriptions   RANITIDINE (ZANTAC) 150 MG CAPSULE    Take 1 capsule (150 mg total) by mouth daily.     Comer Locket, PA-C 08/26/16 0631    Comer Locket, PA-C 08/26/16 [id number]    Merryl Hacker, MD 08/27/16 [phone]

## 2017-02-16 ENCOUNTER — Other Ambulatory Visit: Payer: Self-pay | Admitting: Obstetrics and Gynecology

## 2017-02-16 DIAGNOSIS — Z1231 Encounter for screening mammogram for malignant neoplasm of breast: Secondary | ICD-10-CM

## 2017-03-04 ENCOUNTER — Ambulatory Visit: Payer: BC Managed Care – PPO

## 2017-03-31 ENCOUNTER — Ambulatory Visit: Payer: BC Managed Care – PPO

## 2017-06-11 ENCOUNTER — Ambulatory Visit
Admission: RE | Admit: 2017-06-11 | Discharge: 2017-06-11 | Disposition: A | Discharge status: Home / Self Care | Payer: BC Managed Care – PPO | Source: Ambulatory Visit | Attending: Obstetrics and Gynecology | Admitting: Obstetrics and Gynecology

## 2017-06-11 DIAGNOSIS — Z1231 Encounter for screening mammogram for malignant neoplasm of breast: Secondary | ICD-10-CM

## 2017-09-02 ENCOUNTER — Emergency Department (HOSPITAL_COMMUNITY)
Admission: EM | Admit: 2017-09-02 | Discharge: 2017-09-02 | Disposition: A | Discharge status: Home / Self Care | Payer: BC Managed Care – PPO | Attending: Emergency Medicine | Admitting: Emergency Medicine

## 2017-09-02 ENCOUNTER — Other Ambulatory Visit: Payer: Self-pay

## 2017-09-02 ENCOUNTER — Encounter (HOSPITAL_COMMUNITY): Payer: Self-pay | Admitting: *Deleted

## 2017-09-02 DIAGNOSIS — M545 Low back pain: Secondary | ICD-10-CM | POA: Diagnosis present

## 2017-09-02 DIAGNOSIS — Z79899 Other long term (current) drug therapy: Secondary | ICD-10-CM | POA: Insufficient documentation

## 2017-09-02 DIAGNOSIS — M7918 Myalgia, other site: Secondary | ICD-10-CM | POA: Diagnosis not present

## 2017-09-02 DIAGNOSIS — M5431 Sciatica, right side: Secondary | ICD-10-CM

## 2017-09-02 DIAGNOSIS — I10 Essential (primary) hypertension: Secondary | ICD-10-CM | POA: Diagnosis not present

## 2017-09-02 MED ORDER — METHOCARBAMOL 500 MG PO TABS: 500.0000 mg | ORAL_TABLET | Freq: Two times a day (BID) | ORAL | 0 refills | Status: DC

## 2017-09-02 MED ORDER — PREDNISONE 10 MG PO TABS: 20.0000 mg | ORAL_TABLET | Freq: Every day | ORAL | 0 refills | Status: AC

## 2017-09-02 NOTE — ED Provider Notes (Signed)
[facility]PART[facility] Provider Note   CSN: [id number] Arrival date & time: 09/02/17  1136     History   Chief Complaint Chief Complaint  Patient presents with  . Back Pain    HPI Martha Davis is a 46 y.o. female who presents with right lower buttock pain that began yesterday. She reports that she was at work when the symptoms began. She does not recall any specific trauma, injury, fall. Patient does not report lifting anything heavy recently. She describes the pain as a "spasm, throbbing pain." She states that it radiates down her gluteal region but does not radiate down her right lower extremity. She has been taking ibuprofen x1 for symptoms with minimal improvement. She reports that the pain is worsened with movement. Patient reports that she is still unable to ambulate without difficulty. She denies any numbness or weakness in arms or legs. Denies fevers, weight loss, numbness/weakness of upper and lower extremities, bowel/bladder incontinence, saddle anesthesia, history of back surgery, history of IVDA, cancer. She denies any hip pain, knee pain, rash, redness or swelling.   The history is provided by the patient.    Past Medical History:  Diagnosis Date  . Achalasia and cardiospasm   . Anemia   . Chest pain, unspecified   . 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 Dysphagia, unspecified(787.20)   . Hypertrophy of adenoids alone   . Unspecified essential hypertension     Patient Active Problem List   Diagnosis Date Noted  . Fibroids 02/12/2012  . Menometrorrhagia 02/12/2012  . Anemia 02/12/2012  . HTN (hypertension) 10/01/2011    History reviewed. No pertinent surgical history.  OB History    Gravida Para Term Preterm AB Living   1 1 1     1    SAB TAB Ectopic Multiple Live Births                   Home Medications    Prior to Admission medications   Medication Sig Start Date End Date Taking?  Authorizing Provider  guaiFENesin (ROBITUSSIN) 100 MG/5ML liquid Take 10-20 mLs (200-400 mg total) by mouth every 4 (four) hours as needed for cough. 07/17/16   Noe Gens, PA-C  Lorcaserin HCl ER (BELVIQ XR) 20 MG TB24 Take 20 mg by mouth daily.    [provider]  methocarbamol (ROBAXIN) 500 MG tablet Take 1 tablet (500 mg total) 2 (two) times daily by mouth. 09/02/17   Volanda Napoleon, PA-C  predniSONE (DELTASONE) 10 MG tablet Take 2 tablets (20 mg total) daily for 6 days by mouth. 09/02/17 09/08/17  Volanda Napoleon, PA-C  ranitidine (ZANTAC) 150 MG capsule Take 1 capsule (150 mg total) by mouth daily. 08/26/16   Comer Locket, PA-C    Family History Family History  Problem Relation Age of Onset  . Diabetes Father   . Hypertension Mother   . Stroke Mother   . Breast cancer Neg Hx     Social History Social History   Tobacco Use  . Smoking status: Never Smoker  . Smokeless tobacco: Never Used  Substance Use Topics  . Alcohol use: No  . Drug use: No     Allergies   Patient has no known allergies.   Review of Systems Review of Systems  Constitutional: Negative for fever.  Musculoskeletal: Negative for gait problem.       Right buttock pain  Skin: Negative for color change and rash.  Neurological: Negative for weakness and numbness.     Physical Exam Updated Vital Signs BP 127/73 (BP Location: Left Arm)   Pulse 71   Temp 98.3 F (36.8 C) (Oral)   SpO2 100%   Physical Exam  Constitutional: She is oriented to person, place, and time. She appears well-developed and well-nourished.  Sitting comfortably on examination table  HENT:  Head: Normocephalic and atraumatic.  Mouth/Throat: Oropharynx is clear and moist and mucous membranes are normal.  Eyes: Conjunctivae, EOM and lids are normal. Pupils are equal, round, and reactive to light.  Neck: Full passive range of motion without pain.  Full flexion/extension and lateral movement of neck fully  intact. No bony midline tenderness. No deformities or crepitus.   Pulmonary/Chest: Effort normal.  Musculoskeletal: Normal range of motion.       Cervical back: She exhibits no tenderness.       Thoracic back: She exhibits no tenderness.       Lumbar back: She exhibits no tenderness.       Back:  No T or L-spine midline tenderness. No deformity or crepitus noted. Tenderness palpation overlying the right gluteal region. No overlying warmth, erythema.  Neurological: She is alert and oriented to person, place, and time.  Follows commands, Moves all extremities  5/5 strength to BUE and BLE  Sensation intact throughout all major nerve distributions Positive SLR on the RLE.   Skin: Skin is warm and dry. Capillary refill takes less than 2 seconds.  Psychiatric: She has a normal mood and affect. Her speech is normal.  Nursing note and vitals reviewed.    ED Treatments / Results  Labs (all labs ordered are listed, but only abnormal results are displayed) Labs Reviewed - No data to display  EKG  EKG Interpretation None       Radiology No results found.  Procedures Procedures (including critical care time)  Medications Ordered in ED Medications - No data to display   Initial Impression / Assessment and Plan / ED Course  I have reviewed the triage vital signs and the nursing notes.  Pertinent labs & imaging results that were available during my care of the patient were reviewed by me and considered in my medical decision making (see chart for details).     46 y.o. F who presents right buttock pain that began yesterday. No preceding trauma, injury, fall. No fevers, numbness/weakness, distal to walk in. Patient is afebrile, non-toxic appearing, sitting comfortably on examination table. Vital signs reviewed and stable. No red flag symptoms. No neuro deficits on exam. Positive straight leg raise test on the right. History/physical exam are not concerning for cauda equina or spinal  abscess. Consider musculoskeletal strain versus sciatica. Patient has no midline C, T, L-spine tenderness. She has no history of trauma or fall. Given history/physical exam, no indications for x-ray imaging at this time. Offered to obtain x-ray imaging for further evaluation. Patient declines at this time. I feel this is reasonable to the presentation. Will plan to treat as sciatica. Patient instructed follow-up with her primary care doctor next 24-48 hours for further evaluation. Strict return precautions discussed. Patient expresses understanding and agreement to plan.   Final Clinical Impressions(s) / ED Diagnoses   Final diagnoses:  Musculoskeletal pain  Sciatica of right side    ED Discharge Orders        Ordered    methocarbamol (ROBAXIN) 500 MG tablet  2 times daily,   Status:  Discontinued     09/02/17 1307  predniSONE (DELTASONE) 10 MG tablet  Daily     09/02/17 1308    methocarbamol (ROBAXIN) 500 MG tablet  2 times daily     09/02/17 1310       Desma Mcgregor 09/02/17 1531    Gareth Morgan, MD 09/03/17 [phone]

## 2017-09-02 NOTE — Discharge Instructions (Signed)
You can take Tylenol or Ibuprofen as directed for pain. You can alternate Tylenol and Ibuprofen every 4 hours. If you take Tylenol at 1pm, then you can take Ibuprofen at 5pm. Then you can take Tylenol again at 9pm.   Take Robaxin as prescribed. This medication will make you drowsy. Make sure you are at home when taking your first dose to see how it reacts with you. Do not drive or drink alcohol when taking it.  You can also use Lidocaine patches which you can get at the store to help with pain.   Follow-up with her primary care doctor in the next 24-48 hours for further evaluation.   Return to the Emergency Department immediately for any worsening back pain, neck pain, difficulty walking, numbness/weaknss of your arms or legs, urinary or bowel accidents, fever or any other worsening or concerning symptoms.

## 2017-09-02 NOTE — ED Triage Notes (Signed)
Pt reports onset yesterday of right lower back/buttock pain that is burning and throbbing, radiates down back of right leg. Denies urinary symptoms.

## 2017-11-30 ENCOUNTER — Encounter (HOSPITAL_COMMUNITY): Payer: Self-pay | Admitting: *Deleted

## 2017-11-30 ENCOUNTER — Emergency Department (HOSPITAL_COMMUNITY)
Admission: EM | Admit: 2017-11-30 | Discharge: 2017-12-01 | Disposition: A | Discharge status: Home / Self Care | Payer: BC Managed Care – PPO | Attending: Emergency Medicine | Admitting: Emergency Medicine

## 2017-11-30 ENCOUNTER — Emergency Department (HOSPITAL_COMMUNITY): Payer: BC Managed Care – PPO

## 2017-11-30 DIAGNOSIS — I10 Essential (primary) hypertension: Secondary | ICD-10-CM | POA: Insufficient documentation

## 2017-11-30 DIAGNOSIS — R42 Dizziness and giddiness: Secondary | ICD-10-CM | POA: Insufficient documentation

## 2017-11-30 DIAGNOSIS — R079 Chest pain, unspecified: Secondary | ICD-10-CM | POA: Diagnosis present

## 2017-11-30 DIAGNOSIS — R0789 Other chest pain: Secondary | ICD-10-CM

## 2017-11-30 LAB — I-STAT TROPONIN, ED: TROPONIN I, POC: 0 ng/mL (ref 0.00–0.08)

## 2017-11-30 LAB — CBC
HCT: 38.3 % (ref 36.0–46.0)
Hemoglobin: 12.5 g/dL (ref 12.0–15.0)
MCH: 31.1 pg (ref 26.0–34.0)
MCHC: 32.6 g/dL (ref 30.0–36.0)
MCV: 95.3 fL (ref 78.0–100.0)
PLATELETS: 354 10*3/uL (ref 150–400)
RBC: 4.02 MIL/uL (ref 3.87–5.11)
RDW: 13.8 % (ref 11.5–15.5)
WBC: 7.2 10*3/uL (ref 4.0–10.5)

## 2017-11-30 LAB — BASIC METABOLIC PANEL
Anion gap: 15 (ref 5–15)
BUN: 10 mg/dL (ref 6–20)
CO2: 20 mmol/L — ABNORMAL LOW (ref 22–32)
CREATININE: 1.05 mg/dL — AB (ref 0.44–1.00)
Calcium: 9.2 mg/dL (ref 8.9–10.3)
Chloride: 103 mmol/L (ref 101–111)
Glucose, Bld: 95 mg/dL (ref 65–99)
POTASSIUM: 3.1 mmol/L — AB (ref 3.5–5.1)
SODIUM: 138 mmol/L (ref 135–145)

## 2017-11-30 LAB — I-STAT BETA HCG BLOOD, ED (MC, WL, AP ONLY)

## 2017-11-30 NOTE — ED Triage Notes (Signed)
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 Was sitting down and started to have chest tightness and lightheadedness. Pt had a tooth pulled on Thursday and has been on Tylenol for pain. No hx of the same. 123/84, 133 CBG, 80 HR NSR.   Pt complained of some right leg weakness when walking to the truck. Upon evaluation, no deficits noted by EMS.   20 Gauge Left AC. Given 324 mg of ASA.

## 2017-12-01 LAB — I-STAT TROPONIN, ED: Troponin i, poc: 0 ng/mL (ref 0.00–0.08)

## 2017-12-01 NOTE — Discharge Instructions (Signed)
Please read instructions below. Return to the ER for new or worsening symptoms; including worsening chest pain, shortness of breath, pain that radiates to the arm or neck, pain or shortness of breath worsened with exertion. Follow up with your primary care provider regarding your visit today. ° °

## 2017-12-01 NOTE — ED Notes (Signed)
ED Provider at bedside. 

## 2017-12-01 NOTE — ED Provider Notes (Signed)
[facility]PART[facility] Provider Note   CSN: [id number] Arrival date & time: 11/30/17  1510     History   Chief Complaint Chief Complaint  Patient presents with  . Chest Pain    HPI Martha Davis is a 47 y.o. female presenting to ED with acute onset of central chest tightness with began around 3pm today. Pt states she was sitting at her desk, and began having central chest tightness with assoc diaphoresis and dizziness. States sx lasted about 10 minutes and then resolved. She has been asymptomatic since that time. Denies assoc nausea, radiation of pain, SOB, unilateral leg swelling. No cardiac hx, hx of DVT/PE, exogenous estrogen use. No hx of smoking.   The history is provided by the patient.    Past Medical History:  Diagnosis Date  . Achalasia and cardiospasm   . Anemia   . Chest pain, unspecified   . 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 Dysphagia, unspecified(787.20)   . Hypertrophy of adenoids alone   . Unspecified essential hypertension     Patient Active Problem List   Diagnosis Date Noted  . Fibroids 02/12/2012  . Menometrorrhagia 02/12/2012  . Anemia 02/12/2012  . HTN (hypertension) 10/01/2011    History reviewed. No pertinent surgical history.  OB History    Gravida Para Term Preterm AB Living   1 1 1     1    SAB TAB Ectopic Multiple Live Births                   Home Medications    Prior to Admission medications   Medication Sig Start Date End Date Taking? Authorizing Provider  omeprazole (PRILOSEC) 40 MG capsule Take 1 capsule (40 mg total) by mouth daily. 11/03/11 08/24/14  Milus Banister, MD    Family History Family History  Problem Relation Age of Onset  . Diabetes Father   . Hypertension Mother   . Stroke Mother   . Breast cancer Neg Hx     Social History Social History   Tobacco Use  . Smoking status: Never Smoker  . Smokeless tobacco: Never Used  Substance Use Topics    . Alcohol use: No  . Drug use: No     Allergies   Patient has no known allergies.   Review of Systems Review of Systems  Constitutional: Positive for diaphoresis.  Respiratory: Negative for shortness of breath.   Cardiovascular: Positive for chest pain. Negative for palpitations and leg swelling.  Gastrointestinal: Negative for nausea.  All other systems reviewed and are negative.    Physical Exam Updated Vital Signs BP 106/66   Pulse 74   Temp 98.6 F (37 C) (Oral)   Resp 15   Ht 5\' 7"  (1.702 m) Comment: Simultaneous filing. User may not have seen previous data.  Wt 112.5 kg (248 lb) Comment: Simultaneous filing. User may not have seen previous data.  SpO2 100%   BMI 38.84 kg/m   Physical Exam  Constitutional: She appears well-developed and well-nourished. She does not appear ill. No distress.  HENT:  Head: Normocephalic and atraumatic.  Eyes: Conjunctivae are normal.  Neck: Normal range of motion. Neck supple. No JVD present. No tracheal deviation present.  Cardiovascular: Normal rate, regular rhythm, normal heart sounds, intact distal pulses and normal pulses. Exam reveals no gallop and no friction rub.  No murmur heard. Pulmonary/Chest: Effort normal and breath sounds normal. No stridor. No respiratory distress. She has no  wheezes. She has no rales. She exhibits no tenderness.  Abdominal: Soft. Bowel sounds are normal. She exhibits no distension. There is no tenderness.  Musculoskeletal:  No LE edema or tenderness.  Neurological: She is alert.  Skin: Skin is warm.  Psychiatric: She has a normal mood and affect. Her behavior is normal.  Nursing note and vitals reviewed.    ED Treatments / Results  Labs (all labs ordered are listed, but only abnormal results are displayed) Labs Reviewed  BASIC METABOLIC PANEL - Abnormal; Notable for the following components:      Result Value   Potassium 3.1 (*)    CO2 20 (*)    Creatinine, Ser 1.05 (*)    All other  components within normal limits  CBC  I-STAT TROPONIN, ED  I-STAT BETA HCG BLOOD, ED ([facility], [facility], [facility] ONLY)  I-STAT TROPONIN, ED    EKG  EKG Interpretation  Date/Time:  Monday November 30 2017 15:25:19 EST Ventricular Rate:  70 PR Interval:    QRS Duration: 74 QT Interval:  512 QTC Calculation: 553 R Axis:   13 Text Interpretation:  Sinus rhythm Borderline T abnormalities, anterior leads Prolonged QT interval No old tracing to compare Confirmed by Delora Fuel ([id number]) on 11/30/2017 11:12:11 PM       Radiology Dg Chest 2 View  Result Date: 11/30/2017 CLINICAL DATA:  Chest tightness with dizziness and nausea EXAM: CHEST  2 VIEW COMPARISON:  None. FINDINGS: There is mild scarring in the right base. Lungs elsewhere are clear. Heart size and pulmonary vascularity are normal. No adenopathy. No pneumothorax. No bone lesions. IMPRESSION: Scarring right base. No edema or consolidation. Heart size within normal limits. Electronically Signed   By: Lowella Grip III M.D.   On: 11/30/2017 16:14    Procedures Procedures (including critical care time)  Medications Ordered in ED Medications - No data to display   Initial Impression / Assessment and Plan / ED Course  I have reviewed the triage vital signs and the nursing notes.  Pertinent labs & imaging results that were available during my care of the patient were reviewed by me and considered in my medical decision making (see chart for details).     Pt presenting with chest tightness, occurred at rest, lasting 10 minutes, resolved. Chest pain is not likely of cardiac or pulmonary etiology d/t presentation, PERC negative, VSS, no tracheal deviation, no JVD or new murmur, RRR, breath sounds equal bilaterally, EKG without acute abnormalities, negative troponin x2, and negative CXR. Patient is to be discharged with recommendation to follow up with PCP in regards to today's hospital visit. Pt has been advised to return to the ED if CP becomes  exertional, associated with diaphoresis or nausea, radiates to left jaw/arm, worsens or becomes concerning in any way. Pt appears reliable for follow up and is agreeable to discharge.   Patient discussed with Dr. Roxanne Mins.  Discussed results, findings, treatment and follow up. Patient advised of return precautions. Patient verbalized understanding and agreed with plan.  Final Clinical Impressions(s) / ED Diagnoses   Final diagnoses:  Chest tightness    ED Discharge Orders    None       Robinson, [place] N, PA-C 01/77/93 9030    Glick, David, MD 07/19/29 [phone]

## 2017-12-01 NOTE — ED Notes (Signed)
Pt verbalizes understanding of d/c instructions. Pt ambulatory at d/c with all belongings.   

## 2017-12-17 ENCOUNTER — Other Ambulatory Visit: Payer: Self-pay | Admitting: Orthopedic Surgery

## 2017-12-17 DIAGNOSIS — M5412 Radiculopathy, cervical region: Secondary | ICD-10-CM

## 2017-12-22 ENCOUNTER — Ambulatory Visit
Admission: RE | Admit: 2017-12-22 | Discharge: 2017-12-22 | Disposition: A | Discharge status: Home / Self Care | Payer: BC Managed Care – PPO | Source: Ambulatory Visit | Attending: Orthopedic Surgery | Admitting: Orthopedic Surgery

## 2017-12-22 DIAGNOSIS — M5412 Radiculopathy, cervical region: Secondary | ICD-10-CM

## 2019-11-01 ENCOUNTER — Emergency Department (HOSPITAL_COMMUNITY): Payer: BC Managed Care – PPO

## 2019-11-01 ENCOUNTER — Other Ambulatory Visit: Payer: Self-pay

## 2019-11-01 ENCOUNTER — Encounter (HOSPITAL_COMMUNITY): Payer: Self-pay | Admitting: Family Medicine

## 2019-11-01 ENCOUNTER — Emergency Department (HOSPITAL_COMMUNITY)
Admission: EM | Admit: 2019-11-01 | Discharge: 2019-11-01 | Disposition: A | Discharge status: Home / Self Care | Payer: BC Managed Care – PPO | Attending: Emergency Medicine | Admitting: Emergency Medicine

## 2019-11-01 DIAGNOSIS — Y999 Unspecified external cause status: Secondary | ICD-10-CM | POA: Diagnosis not present

## 2019-11-01 DIAGNOSIS — I10 Essential (primary) hypertension: Secondary | ICD-10-CM | POA: Diagnosis not present

## 2019-11-01 DIAGNOSIS — R0789 Other chest pain: Secondary | ICD-10-CM | POA: Insufficient documentation

## 2019-11-01 DIAGNOSIS — Y92410 Unspecified street and highway as the place of occurrence of the external cause: Secondary | ICD-10-CM | POA: Insufficient documentation

## 2019-11-01 DIAGNOSIS — Y939 Activity, unspecified: Secondary | ICD-10-CM | POA: Insufficient documentation

## 2019-11-01 DIAGNOSIS — M25512 Pain in left shoulder: Secondary | ICD-10-CM | POA: Insufficient documentation

## 2019-11-01 DIAGNOSIS — M542 Cervicalgia: Secondary | ICD-10-CM | POA: Diagnosis not present

## 2019-11-01 DIAGNOSIS — R52 Pain, unspecified: Secondary | ICD-10-CM

## 2019-11-01 DIAGNOSIS — S0990XA Unspecified injury of head, initial encounter: Secondary | ICD-10-CM | POA: Diagnosis not present

## 2019-11-01 DIAGNOSIS — R200 Anesthesia of skin: Secondary | ICD-10-CM | POA: Insufficient documentation

## 2019-11-01 MED ORDER — ACETAMINOPHEN 500 MG PO TABS
1000.0000 mg | ORAL_TABLET | Freq: Once | ORAL | Status: AC
  Administered 2019-11-01: 21:00:00 1000 mg via ORAL
  Filled 2019-11-01: qty 2

## 2019-11-01 MED ORDER — NAPROXEN 375 MG PO TABS: 375.0000 mg | ORAL_TABLET | Freq: Two times a day (BID) | ORAL | 0 refills | Status: DC

## 2019-11-01 MED ORDER — METHOCARBAMOL 500 MG PO TABS: 500.0000 mg | ORAL_TABLET | Freq: Two times a day (BID) | ORAL | 0 refills | Status: DC

## 2019-11-01 NOTE — ED Notes (Signed)
Spoke with Dr. Loletha Grayer. Tegeler about patients condition. Obtained orders.

## 2019-11-01 NOTE — ED Notes (Signed)
An After Visit Summary was printed and given to the patient. Discharge instructions given and no further questions at this time.  
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
## 2019-11-02 ENCOUNTER — Other Ambulatory Visit: Payer: Self-pay | Admitting: Family Medicine

## 2019-11-02 DIAGNOSIS — Z1231 Encounter for screening mammogram for malignant neoplasm of breast: Secondary | ICD-10-CM

## 2019-11-04 ENCOUNTER — Other Ambulatory Visit: Payer: Self-pay

## 2019-11-04 ENCOUNTER — Ambulatory Visit
Admission: RE | Admit: 2019-11-04 | Discharge: 2019-11-04 | Disposition: A | Discharge status: Home / Self Care | Payer: BC Managed Care – PPO | Source: Ambulatory Visit | Attending: Family Medicine | Admitting: Family Medicine

## 2019-11-04 DIAGNOSIS — Z1231 Encounter for screening mammogram for malignant neoplasm of breast: Secondary | ICD-10-CM

## 2019-11-08 ENCOUNTER — Other Ambulatory Visit: Payer: Self-pay | Admitting: Family Medicine

## 2019-11-08 DIAGNOSIS — R928 Other abnormal and inconclusive findings on diagnostic imaging of breast: Secondary | ICD-10-CM

## 2019-11-16 ENCOUNTER — Ambulatory Visit
Admission: RE | Admit: 2019-11-16 | Discharge: 2019-11-16 | Disposition: A | Discharge status: Home / Self Care | Payer: BC Managed Care – PPO | Source: Ambulatory Visit | Attending: Family Medicine | Admitting: Family Medicine

## 2019-11-16 ENCOUNTER — Other Ambulatory Visit: Payer: Self-pay

## 2019-11-16 DIAGNOSIS — R928 Other abnormal and inconclusive findings on diagnostic imaging of breast: Secondary | ICD-10-CM

## 2019-11-25 ENCOUNTER — Ambulatory Visit: Payer: BC Managed Care – PPO | Attending: Internal Medicine

## 2019-11-25 DIAGNOSIS — Z20822 Contact with and (suspected) exposure to covid-19: Secondary | ICD-10-CM

## 2019-11-26 LAB — NOVEL CORONAVIRUS, NAA: SARS-CoV-2, NAA: NOT DETECTED

## 2019-12-26 ENCOUNTER — Ambulatory Visit: Payer: BC Managed Care – PPO | Attending: Internal Medicine

## 2019-12-26 DIAGNOSIS — Z20822 Contact with and (suspected) exposure to covid-19: Secondary | ICD-10-CM

## 2019-12-27 LAB — NOVEL CORONAVIRUS, NAA: SARS-CoV-2, NAA: NOT DETECTED

## 2020-07-31 ENCOUNTER — Other Ambulatory Visit: Payer: Self-pay

## 2020-07-31 ENCOUNTER — Ambulatory Visit (HOSPITAL_COMMUNITY)
Admission: EM | Admit: 2020-07-31 | Discharge: 2020-07-31 | Disposition: A | Discharge status: Home / Self Care | Payer: BC Managed Care – PPO | Attending: Family Medicine | Admitting: Family Medicine

## 2020-07-31 ENCOUNTER — Encounter (HOSPITAL_COMMUNITY): Payer: Self-pay

## 2020-07-31 DIAGNOSIS — T63421A Toxic effect of venom of ants, accidental (unintentional), initial encounter: Secondary | ICD-10-CM

## 2020-07-31 DIAGNOSIS — R2242 Localized swelling, mass and lump, left lower limb: Secondary | ICD-10-CM

## 2020-07-31 MED ORDER — PREDNISONE 20 MG PO TABS: 40.0000 mg | ORAL_TABLET | Freq: Every day | ORAL | 0 refills | Status: DC

## 2020-07-31 NOTE — Discharge Instructions (Signed)
You may also take Benadryl if needed.
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
## 2020-10-02 ENCOUNTER — Other Ambulatory Visit: Payer: Self-pay | Admitting: Family Medicine

## 2020-10-02 DIAGNOSIS — Z1231 Encounter for screening mammogram for malignant neoplasm of breast: Secondary | ICD-10-CM

## 2020-10-16 ENCOUNTER — Other Ambulatory Visit: Payer: Self-pay

## 2020-10-16 ENCOUNTER — Ambulatory Visit
Admission: RE | Admit: 2020-10-16 | Discharge: 2020-10-16 | Disposition: A | Discharge status: Home / Self Care | Payer: BC Managed Care – PPO | Source: Ambulatory Visit | Attending: Family Medicine | Admitting: Family Medicine

## 2020-10-16 ENCOUNTER — Other Ambulatory Visit: Payer: Self-pay | Admitting: Family Medicine

## 2020-10-16 DIAGNOSIS — S299XXA Unspecified injury of thorax, initial encounter: Secondary | ICD-10-CM

## 2020-10-16 DIAGNOSIS — Z1231 Encounter for screening mammogram for malignant neoplasm of breast: Secondary | ICD-10-CM

## 2020-10-24 ENCOUNTER — Other Ambulatory Visit: Payer: Self-pay | Admitting: Family Medicine

## 2020-10-25 ENCOUNTER — Other Ambulatory Visit: Payer: Self-pay | Admitting: Family Medicine

## 2020-10-25 DIAGNOSIS — S299XXA Unspecified injury of thorax, initial encounter: Secondary | ICD-10-CM

## 2020-11-15 ENCOUNTER — Ambulatory Visit: Payer: BC Managed Care – PPO

## 2020-12-05 ENCOUNTER — Other Ambulatory Visit: Payer: Self-pay

## 2020-12-05 ENCOUNTER — Ambulatory Visit: Admission: RE | Admit: 2020-12-05 | Payer: BC Managed Care – PPO | Source: Ambulatory Visit

## 2020-12-05 ENCOUNTER — Ambulatory Visit
Admission: RE | Admit: 2020-12-05 | Discharge: 2020-12-05 | Disposition: A | Discharge status: Home / Self Care | Payer: BC Managed Care – PPO | Source: Ambulatory Visit | Attending: Family Medicine | Admitting: Family Medicine

## 2020-12-05 DIAGNOSIS — S299XXA Unspecified injury of thorax, initial encounter: Secondary | ICD-10-CM

## 2020-12-23 ENCOUNTER — Encounter (HOSPITAL_COMMUNITY): Payer: Self-pay | Admitting: Emergency Medicine

## 2020-12-23 ENCOUNTER — Ambulatory Visit (HOSPITAL_COMMUNITY)
Admission: EM | Admit: 2020-12-23 | Discharge: 2020-12-23 | Disposition: A | Discharge status: Home / Self Care | Payer: BC Managed Care – PPO | Attending: Family Medicine | Admitting: Family Medicine

## 2020-12-23 DIAGNOSIS — L02411 Cutaneous abscess of right axilla: Secondary | ICD-10-CM | POA: Diagnosis not present

## 2020-12-23 MED ORDER — SULFAMETHOXAZOLE-TRIMETHOPRIM 800-160 MG PO TABS: 1.0000 | ORAL_TABLET | Freq: Two times a day (BID) | ORAL | 0 refills | Status: AC

## 2020-12-23 MED ORDER — HIBICLENS 4 % EX LIQD: Freq: Every day | CUTANEOUS | 0 refills | Status: DC | PRN

## 2020-12-23 NOTE — ED Provider Notes (Signed)
[facility]    CSN: [id number] Arrival date & time: 12/23/20  1200      History   Chief Complaint Chief Complaint  Patient presents with  . Abscess    Rt axillary    HPI Martha Davis is a 50 y.o. female.   Patient here today with 5-day history of worsening swelling redness and pain in her right axilla.  She states she has a long history of recurrent boils in this area and this feels similar.  She is the area is still very firm, and no drainage, fevers, chills, sweats, aches.  No injury to area.  Has been using warm compresses with minimal relief.     Past Medical History:  Diagnosis Date  . Achalasia and cardiospasm   . Anemia   . Chest pain, unspecified   . 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 Dysphagia, unspecified(787.20)   . Hypertrophy of adenoids alone   . Unspecified essential hypertension     Patient Active Problem List   Diagnosis Date Noted  . Fibroids 02/12/2012  . Menometrorrhagia 02/12/2012  . Anemia 02/12/2012  . HTN (hypertension) 10/01/2011    Past Surgical History:  Procedure Laterality Date  . NECK SURGERY      OB History    Gravida  1   Para  1   Term  1   Preterm      AB      Living  1     SAB      IAB      Ectopic      Multiple      Live Births               Home Medications    Prior to Admission medications   Medication Sig Start Date End Date Taking? Authorizing Provider  chlorhexidine (HIBICLENS) 4 % external liquid Apply topically daily as needed. 12/23/20  Yes Volney American, PA-C  sulfamethoxazole-trimethoprim (BACTRIM DS) 800-160 MG tablet Take 1 tablet by mouth 2 (two) times daily for 7 days. 12/23/20 12/30/20 Yes Volney American, PA-C  predniSONE (DELTASONE) 20 MG tablet Take 2 tablets (40 mg total) by mouth daily. 07/31/20   Vanessa Kick, MD    Family History Family History  Problem Relation Age of Onset  . Diabetes Father   . Hypertension  Mother   . Stroke Mother   . Breast cancer Neg Hx     Social History Social History   Tobacco Use  . Smoking status: Never Smoker  . Smokeless tobacco: Never Used  Vaping Use  . Vaping Use: Never used  Substance Use Topics  . Alcohol use: No  . Drug use: No     Allergies   Patient has no known allergies.   Review of Systems Review of Systems Per HPI Physical Exam Triage Vital Signs ED Triage Vitals  Enc Vitals Group     BP 12/23/20 1254 127/73     Pulse Rate 12/23/20 1254 81     Resp 12/23/20 1254 18     Temp 12/23/20 1254 99 F (37.2 C)     Temp Source 12/23/20 1254 Oral     SpO2 12/23/20 1254 100 %     Weight --      Height --      Head Circumference --      Peak Flow --      Pain Score 12/23/20 1251 8     Pain Loc --  Pain Edu? --      Excl. in [place]? --    No data found.  Updated Vital Signs BP 127/73 (BP Location: Left Arm)   Pulse 81   Temp 99 F (37.2 C) (Oral)   Resp 18   SpO2 100%   Visual Acuity Right Eye Distance:   Left Eye Distance:   Bilateral Distance:    Right Eye Near:   Left Eye Near:    Bilateral Near:     Physical Exam Vitals and nursing note reviewed.  Constitutional:      Appearance: Normal appearance. She is not ill-appearing.  HENT:     Head: Atraumatic.  Eyes:     Extraocular Movements: Extraocular movements intact.     Conjunctiva/sclera: Conjunctivae normal.  Cardiovascular:     Rate and Rhythm: Normal rate and regular rhythm.     Heart sounds: Normal heart sounds.  Pulmonary:     Effort: Pulmonary effort is normal.     Breath sounds: Normal breath sounds.  Musculoskeletal:        General: Swelling and tenderness present. Normal range of motion.     Cervical back: Normal range of motion and neck supple.  Skin:    General: Skin is warm and dry.     Findings: Erythema present.     Comments: 2 to 2.5 cm erythematous firm boil present right axilla, significantly tender to palpation.  No fluctuance, active  drainage.  Neurological:     Mental Status: She is alert and oriented to person, place, and time.  Psychiatric:        Mood and Affect: Mood normal.        Thought Content: Thought content normal.        Judgment: Judgment normal.      UC Treatments / Results  Labs (all labs ordered are listed, but only abnormal results are displayed) Labs Reviewed - No data to display  EKG   Radiology No results found.  Procedures Procedures (including critical care time)  Medications Ordered in UC Medications - No data to display  Initial Impression / Assessment and Plan / UC Course  I have reviewed the triage vital signs and the nursing notes.  Pertinent labs & imaging results that were available during my care of the patient were reviewed by me and considered in my medical decision making (see chart for details).     Patient declines attempted I&D today which is reasonable given lack of fluctuance at this time.  Will treat with course of Bactrim, Hibiclens, warm compresses, over-the-counter pain relievers.  Discussed return if worsening, not resolving for reevaluation and reconsideration of I&D.  Final Clinical Impressions(s) / UC Diagnoses   Final diagnoses:  Abscess of axilla, right   Discharge Instructions   None    ED Prescriptions    Medication Sig Dispense Auth. Provider   sulfamethoxazole-trimethoprim (BACTRIM DS) 800-160 MG tablet Take 1 tablet by mouth 2 (two) times daily for 7 days. 14 tablet Volney American, [place]   chlorhexidine (HIBICLENS) 4 % external liquid Apply topically daily as needed. 120 mL Volney American, [place]     PDMP not reviewed this encounter.   Volney American, [place] 12/23/20 1339

## 2020-12-23 NOTE — ED Triage Notes (Signed)
Pt presents today with c/o of pain/swelling to right axilla x 5 days, no drainage.

## 2021-04-08 ENCOUNTER — Encounter (HOSPITAL_COMMUNITY): Payer: Self-pay

## 2021-04-08 ENCOUNTER — Encounter: Payer: Self-pay | Admitting: Podiatry

## 2021-04-08 ENCOUNTER — Other Ambulatory Visit: Payer: Self-pay

## 2021-04-08 ENCOUNTER — Ambulatory Visit (INDEPENDENT_AMBULATORY_CARE_PROVIDER_SITE_OTHER): Payer: BC Managed Care – PPO

## 2021-04-08 ENCOUNTER — Ambulatory Visit (HOSPITAL_COMMUNITY)
Admission: EM | Admit: 2021-04-08 | Discharge: 2021-04-08 | Disposition: A | Discharge status: Home / Self Care | Payer: BC Managed Care – PPO | Attending: Emergency Medicine | Admitting: Emergency Medicine

## 2021-04-08 ENCOUNTER — Ambulatory Visit (INDEPENDENT_AMBULATORY_CARE_PROVIDER_SITE_OTHER): Payer: BC Managed Care – PPO | Admitting: Podiatry

## 2021-04-08 DIAGNOSIS — M79672 Pain in left foot: Secondary | ICD-10-CM | POA: Diagnosis not present

## 2021-04-08 DIAGNOSIS — S92355A Nondisplaced fracture of fifth metatarsal bone, left foot, initial encounter for closed fracture: Secondary | ICD-10-CM

## 2021-04-08 DIAGNOSIS — M21619 Bunion of unspecified foot: Secondary | ICD-10-CM | POA: Diagnosis not present

## 2021-04-08 MED ORDER — MELOXICAM 15 MG PO TABS: 15.0000 mg | ORAL_TABLET | Freq: Every day | ORAL | 0 refills | Status: DC

## 2021-04-08 NOTE — ED Triage Notes (Signed)
Pt reports L foot pain since 06/03.  Was walking and foot twisted on gravel.  States pain worse with ambulation.  Swelling to L foot noted.  Took Advil for pain with no relief.

## 2021-04-08 NOTE — ED Notes (Signed)
Xray report received over the phone for pt. Martha Loa PA notified.

## 2021-04-08 NOTE — ED Provider Notes (Signed)
[facility]  ____________________________________________  Time seen: Approximately 11:38 AM  I have reviewed the triage vital signs and the nursing notes.   HISTORY  Chief Complaint Foot Pain (L)    HPI Martha Davis is a 50 y.o. female who presents the emergency department complaining of pain to the lateral midfoot for the past 10 days.  Patient states that she was walking over some loose mulch when she had an inversion style injury.  Patient has not had any pain to the ankle but has had pain to the mid/lateral aspect of the foot.  She has had some swelling to the area.  Initially she thought that the symptoms would improve with conservative treatment with over-the-counter medications and rest however pain and swelling has persisted.  No other injury or complaint.  She had a similar injury to the right foot with fracture but has had no injuries to the left foot.       Past Medical History:  Diagnosis Date   Achalasia and cardiospasm    Anemia    Chest pain, unspecified    Diaphragmatic hernia without mention of obstruction or gangrene    Dysphagia, unspecified(787.20)    Hypertrophy of adenoids alone    Unspecified essential hypertension     Patient Active Problem List   Diagnosis Date Noted   Fibroids 02/12/2012   Menometrorrhagia 02/12/2012   Anemia 02/12/2012   HTN (hypertension) 10/01/2011    Past Surgical History:  Procedure Laterality Date   NECK SURGERY      Prior to Admission medications   Medication Sig Start Date End Date Taking? Authorizing Provider  chlorhexidine (HIBICLENS) 4 % external liquid Apply topically daily as needed. 12/23/20  Yes Volney American, PA-C  meloxicam (MOBIC) 15 MG tablet Take 1 tablet (15 mg total) by mouth daily. 04/08/21  Yes Seline Enzor, Charline Bills, PA-C  predniSONE (DELTASONE) 20 MG tablet Take 2 tablets (40 mg total) by mouth daily. 07/31/20   Vanessa Kick, MD    Allergies Patient has no known  allergies.  Family History  Problem Relation Age of Onset   Diabetes Father    Hypertension Mother    Stroke Mother    Breast cancer Neg Hx     Social History Social History   Tobacco Use   Smoking status: Never   Smokeless tobacco: Never  Vaping Use   Vaping Use: Never used  Substance Use Topics   Alcohol use: No   Drug use: No     Review of Systems  Constitutional: No fever/chills Eyes: No visual changes. No discharge ENT: No upper respiratory complaints. Cardiovascular: no chest pain. Respiratory: no cough. No SOB. Gastrointestinal: No abdominal pain.  No nausea, no vomiting.  No diarrhea.  No constipation. Musculoskeletal: Left foot injury/pain Skin: Negative for rash, abrasions, lacerations, ecchymosis. Neurological: Negative for headaches, focal weakness or numbness.  10 System ROS otherwise negative.  ____________________________________________   PHYSICAL EXAM:  VITAL SIGNS: ED Triage Vitals  Enc Vitals Group     BP 04/08/21 1119 122/81     Pulse Rate 04/08/21 1119 71     Resp 04/08/21 1119 18     Temp 04/08/21 1119 99.4 F (37.4 C)     Temp Source 04/08/21 1119 Oral     SpO2 04/08/21 1119 99 %     Weight --      Height --      Head Circumference --      Peak Flow --  Pain Score 04/08/21 1117 8     Pain Loc --      Pain Edu? --      Excl. in [place]? --      Constitutional: Alert and oriented. Well appearing and in no acute distress. Eyes: Conjunctivae are normal. PERRL. EOMI. Head: Atraumatic. ENT:      Ears:       Nose: No congestion/rhinnorhea.      Mouth/Throat: Mucous membranes are moist.  Neck: No stridor.    Cardiovascular: Normal rate, regular rhythm. Normal S1 and S2.  Good peripheral circulation. Respiratory: Normal respiratory effort without tachypnea or retractions. Lungs CTAB. Good air entry to the bases with no decreased or absent breath sounds. Musculoskeletal: Full range of motion to all extremities. No gross deformities  appreciated.  Visualization of the left foot reveals mild edema about the midfoot.  No obvious deformity.  No ecchymosis.  No open wounds.  Patient is tender to palpation over the base of the fourth and fifth metatarsal with no palpable abnormality.  No other tenderness to palpation of the osseous structures of the foot.  Dorsalis pedis pulse intact.  Sensation and capillary refill intact all digits. Neurologic:  Normal speech and language. No gross focal neurologic deficits are appreciated.  Skin:  Skin is warm, dry and intact. No rash noted. Psychiatric: Mood and affect are normal. Speech and behavior are normal. Patient exhibits appropriate insight and judgement.   ____________________________________________   LABS (all labs ordered are listed, but only abnormal results are displayed)  Labs Reviewed - No data to display ____________________________________________  EKG   ____________________________________________  RADIOLOGY I personally viewed and evaluated these images as part of my medical decision making, as well as reviewing the written report by the radiologist.  ED Provider Interpretation: Findings on x-ray are consistent with nondisplaced fifth metatarsal fracture  DG Foot Complete Left  Result Date: 04/08/2021 CLINICAL DATA:  Pain following rolling injury EXAM: LEFT FOOT - COMPLETE 3+ VIEW COMPARISON:  None. FINDINGS: Frontal, oblique, and lateral views were obtained. There is a fracture of the distal fifth metatarsal diaphysis with alignment near anatomic. No other fracture. No dislocation. 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 There is spurring in the dorsal midfoot. Other joint spaces appear normal. No erosive changes. IMPRESSION: Fracture distal fifth metatarsal diaphysis with alignment near anatomic. No other fracture. No dislocation. Foci of osteoarthritic change  as noted. These results will be called to the ordering clinician or representative by the Radiologist Assistant, and communication documented in the PACS or [employer]. Electronically Signed   By: Lowella Grip III M.D.   On: 04/08/2021 12:02    ____________________________________________    PROCEDURES  Procedure(s) performed:    Procedures    Medications - No data to display   ____________________________________________   INITIAL IMPRESSION / ASSESSMENT AND PLAN / ED COURSE  Pertinent labs & imaging results that were available during my care of the patient were reviewed by me and considered in my medical decision making (see chart for details).  Review of the  CSRS was performed in accordance of the [place] prior to dispensing any controlled drugs.           Patient's diagnosis is consistent with fifth metatarsal fracture.  Patient presented to the urgent care complaining of left foot pain.  Patient sustained an injury 10 days ago but has had ongoing symptoms.  Concern for fracture versus sprain existed and x-ray was obtained.  Imaging revealed fracture of the fifth metatarsal with no displacement.  Patient is given postop shoe and recommended to follow-up with podiatry..  Patient will be prescribed anti-inflammatory for symptom improvement as patient does not want anything "stronger" for pain relief.  Patient is given ED precautions to return to the ED for any worsening or new symptoms.     ____________________________________________  FINAL CLINICAL IMPRESSION(S) / DIAGNOSES  Final diagnoses:  Closed nondisplaced fracture of fifth metatarsal bone of left foot, initial encounter      NEW MEDICATIONS STARTED DURING THIS VISIT:  ED Discharge Orders          Ordered    meloxicam (MOBIC) 15 MG tablet  Daily        04/08/21 1233                This chart was dictated using voice recognition software/Dragon. Despite best efforts to proofread,  errors can occur which can change the meaning. Any change was purely unintentional.    Darletta Moll, PA-C 04/08/21 1235

## 2021-04-09 NOTE — Progress Notes (Signed)
Subjective:   Patient ID: Martha Davis, female   DOB: 50 y.o.   MRN: [id number]   HPI Patient states that she was walking up a hill 10 days ago on June 3 and twisted her left foot and felt a crack and its been sore since and she went to an urgent care patient states that it is very tender and hard for her to walk on.  Patient does not smoke likes to be active   Review of Systems  All other systems reviewed and are negative.      Objective:  Physical Exam Vitals and nursing note reviewed.  Constitutional:      Appearance: She is well-developed.  Pulmonary:     Effort: Pulmonary effort is normal.  Musculoskeletal:        General: Normal range of motion.  Skin:    General: Skin is warm.  Neurological:     Mental Status: She is alert.    Neurovascular status intact muscle strength adequate range of motion adequate with patient found to have severe discomfort in the left distal shaft of the fifth metatarsal with swelling around the area and pain with patient having trouble walking and getting ready to go to [place] at the end of July in approximate 6 weeks     Assessment:  Fracture of the left fifth metatarsal distal shaft with pain     Plan:  H&P reviewed condition and went ahead today and advised on elevation compression and dispensed air fracture walker for complete immobilization.  Patient will be seen back to recheck  X-rays indicate that there is a fracture of the distal shaft fifth metatarsal localized with mild displacement

## 2021-05-02 ENCOUNTER — Ambulatory Visit (INDEPENDENT_AMBULATORY_CARE_PROVIDER_SITE_OTHER): Payer: BC Managed Care – PPO

## 2021-05-02 ENCOUNTER — Ambulatory Visit (INDEPENDENT_AMBULATORY_CARE_PROVIDER_SITE_OTHER): Payer: BC Managed Care – PPO | Admitting: Podiatry

## 2021-05-02 ENCOUNTER — Encounter: Payer: Self-pay | Admitting: Podiatry

## 2021-05-02 ENCOUNTER — Other Ambulatory Visit: Payer: Self-pay

## 2021-05-02 DIAGNOSIS — S92355A Nondisplaced fracture of fifth metatarsal bone, left foot, initial encounter for closed fracture: Secondary | ICD-10-CM | POA: Diagnosis not present

## 2021-05-02 NOTE — Progress Notes (Signed)
Dg  

## 2021-05-05 NOTE — Progress Notes (Signed)
Subjective:   Patient ID: Martha Davis, female   DOB: 50 y.o.   MRN: [id number]   HPI Patient states that she is improving still having some swelling in the outside of her left foot but better than previous   ROS      Objective:  Physical Exam  Neurovascular status intact with patient's fifth metatarsal left showing swelling still but improved from previous with moderate discomfort when palp     Assessment:  Inflammatory fracture fifth metatarsal left which appears to be healing with immobilization     Plan:  H&P reviewed gradual increase in activities return to rigid bottom shoes over the next 1 to 2 weeks and that complete recovery should occur in the next 6 to 8 weeks.  Encouraged to call with questions concerns  X-rays indicate that there is multiple signs of callus formation around the fracture of the neck of the fifth metatarsal left

## 2021-06-13 IMAGING — CT CT CERVICAL SPINE W/O CM
3 of 4 series · 13 of 33 positions shown, 16 images · non-contrast
Comparison: None.

CLINICAL DATA: Status post trauma.

EXAM:
CT CERVICAL SPINE WITHOUT CONTRAST
TECHNIQUE: Multidetector CT imaging of the cervical spine was performed without
intravenous contrast. Multiplanar CT image reconstructions were also
generated.

[Series 7: orthogonal bone · axial · 0.23mm/px · z∈[+1484,+1612]mm · 5 of 103 slices shown, 7 images]
[im 18/103  soft-tissue]
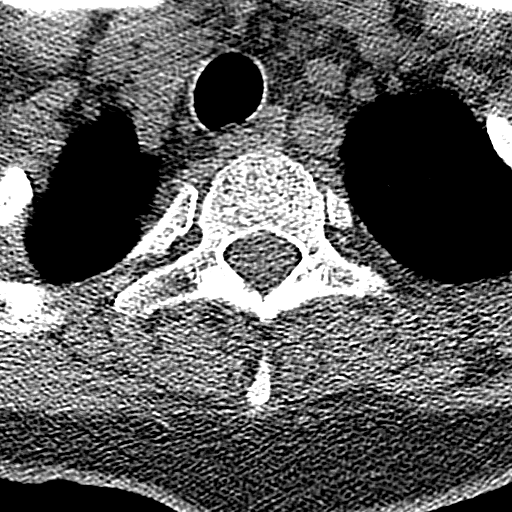
[im 18/103  bone]
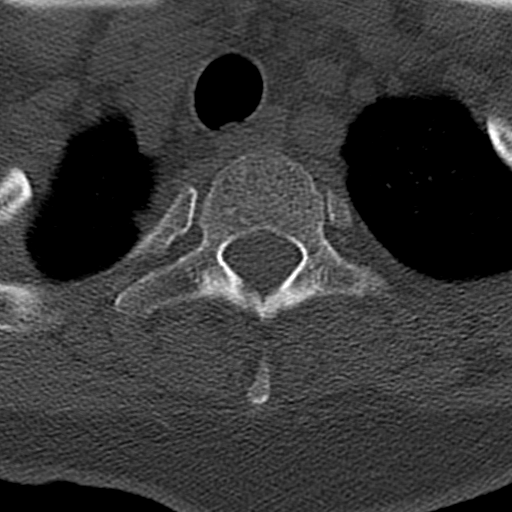
[im 35/103  bone]
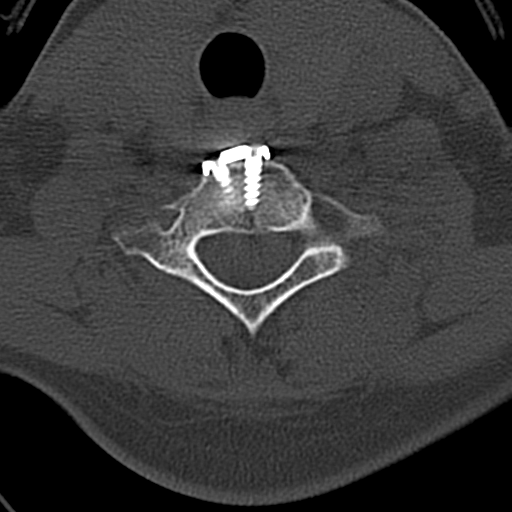
[im 52/103  bone]
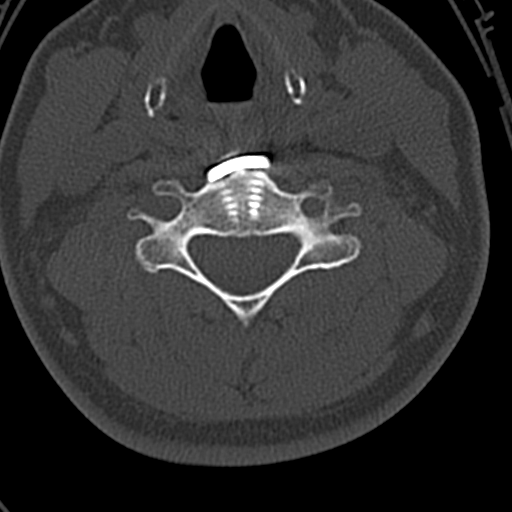
[im 69/103  bone]
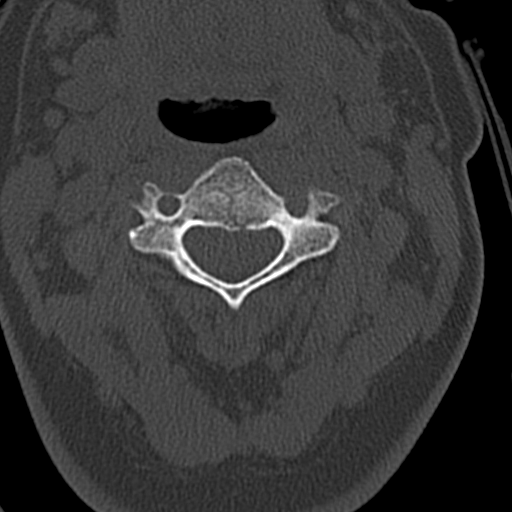
[im 86/103  soft-tissue]
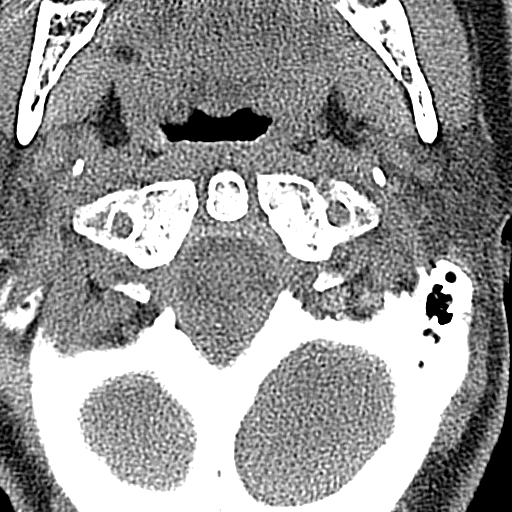
[im 86/103  bone]
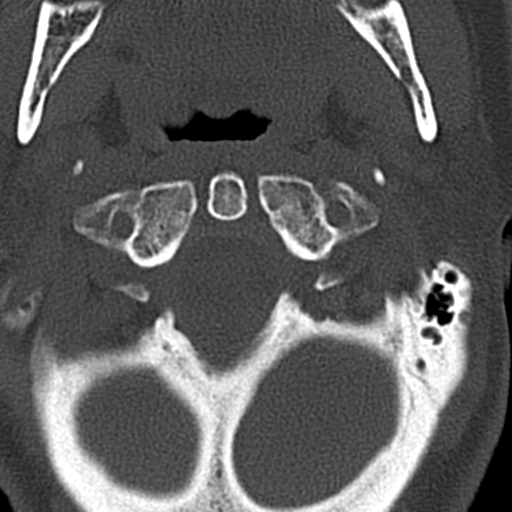

[Series 8: coronal bone · coronal · 0.30mm/px · 3 of 61 slices shown]
[im 13/61  bone]
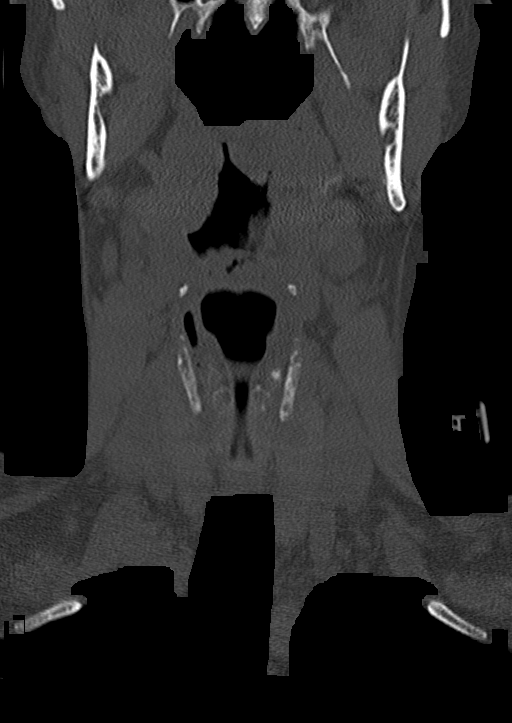
[im 25/61  bone]
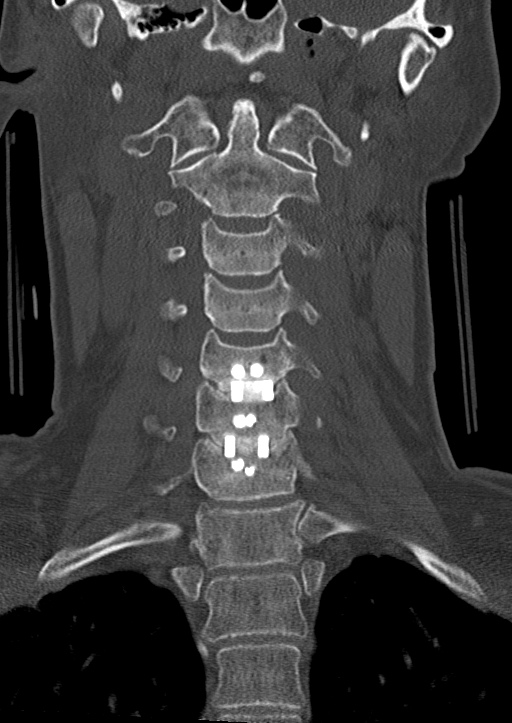
[im 37/61  bone]
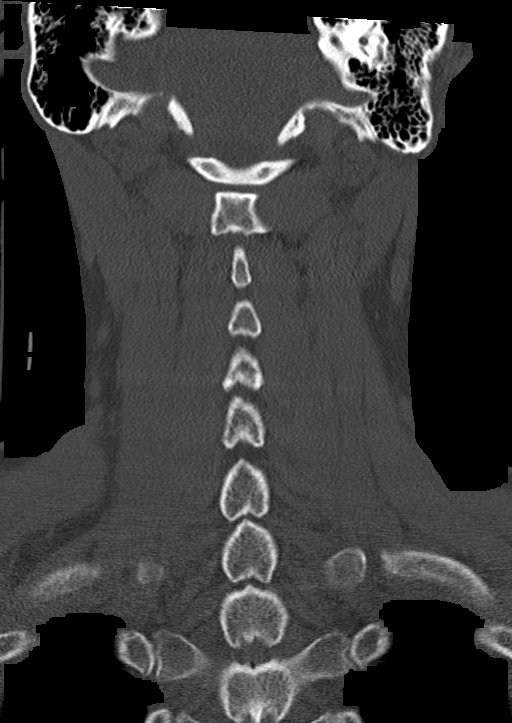

[Series 9: sagittal bone · sagittal · 0.30mm/px · 5 of 51 slices shown, 6 images]
[im 17/51  bone]
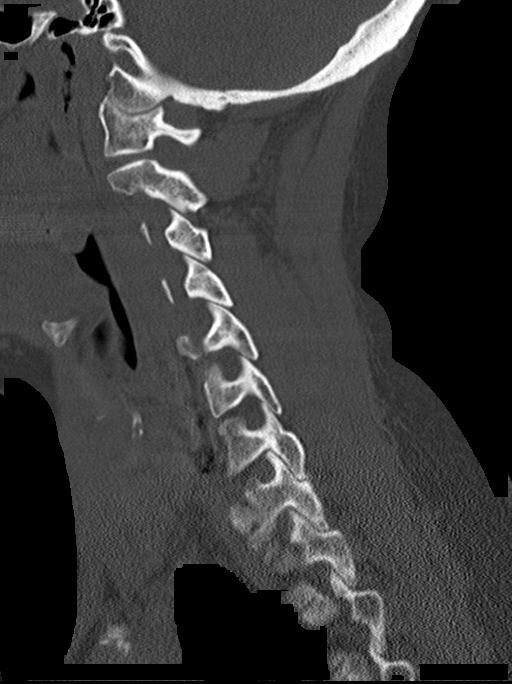
[im 21/51  bone]
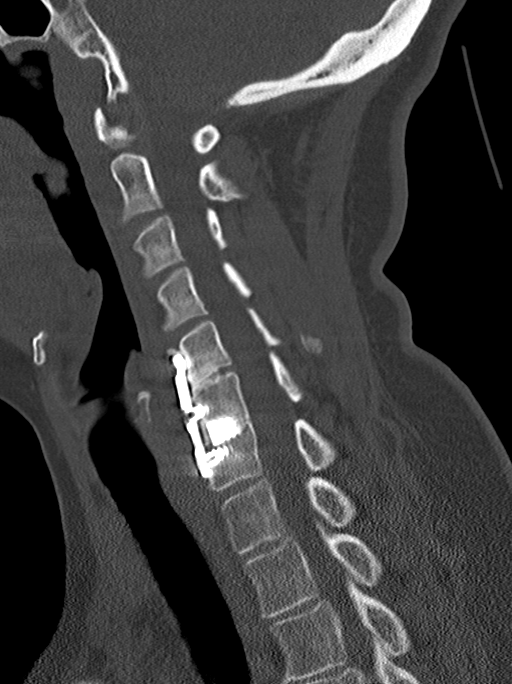
[im 26/51  soft-tissue]
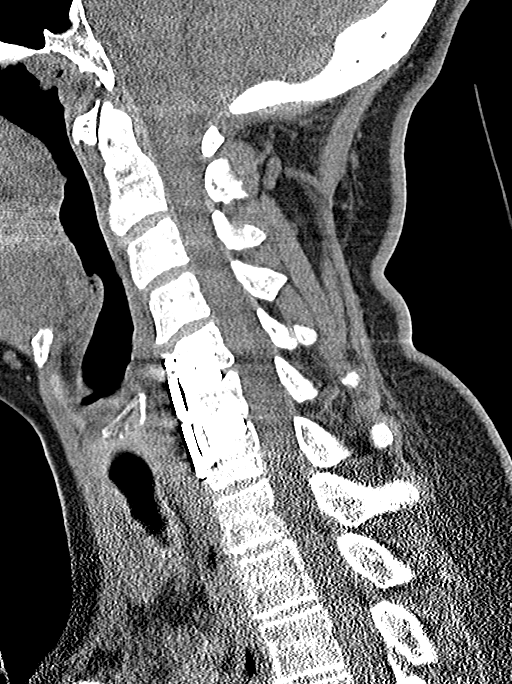
[im 26/51  bone]
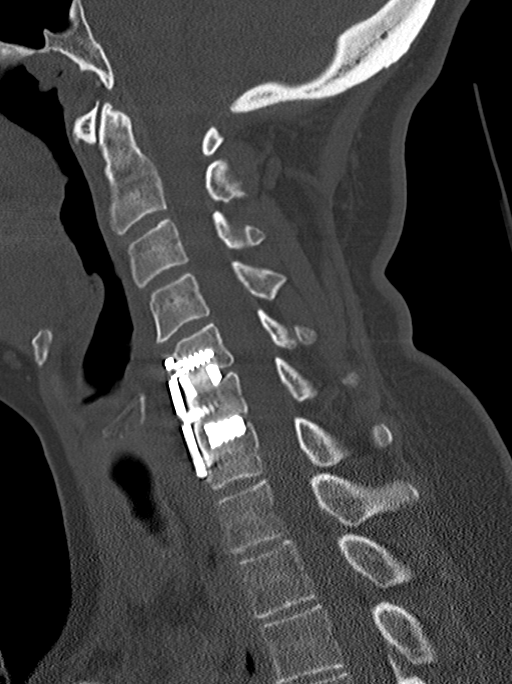
[im 30/51  bone]
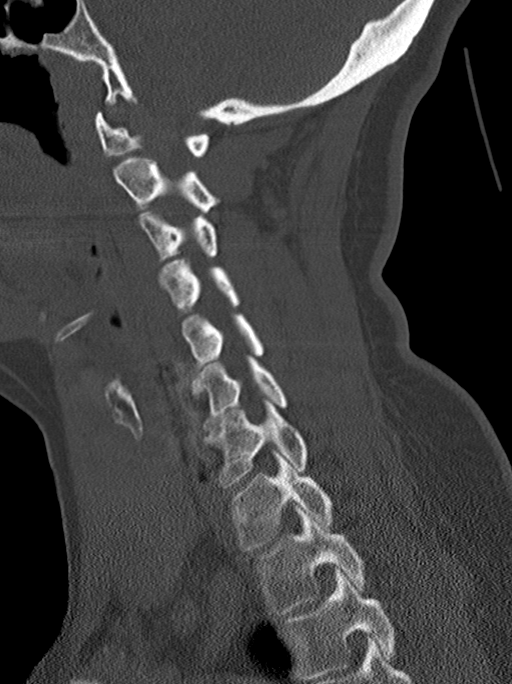
[im 34/51  bone]
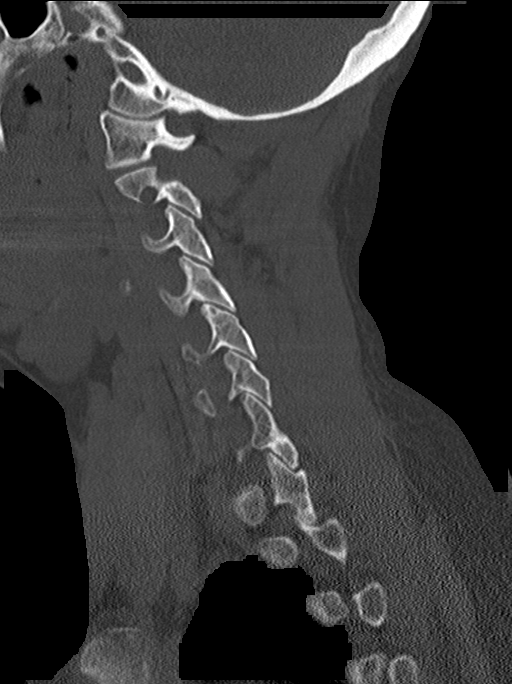

[13 of 33 positions shown; findings below may reference images not displayed]

FINDINGS: Alignment: Normal.

Skull base and vertebrae: No acute fracture. No primary bone lesion
or focal pathologic process.

A metallic density fusion plate and screws are seen along the
anterior aspect of the C5, C6 and C7 vertebral bodies. Metallic
density operative material is seen within the intervertebral disc
spaces at the levels of C5-C6 and C6-C7.

Soft tissues and spinal canal: No prevertebral fluid or swelling. No
visible canal hematoma.

Disc levels:

C2-3: Normal endplates. Normal disc height and morphology. Normal
bilateral uncovertebral and apophyseal joints. Normal central canal
and intervertebral neuroforamina.
C3-4: Normal endplates. Normal disc height and morphology. Normal
bilateral uncovertebral and apophyseal joints. Normal central canal
and intervertebral neuroforamina.
C4-5: Normal endplates. Normal disc height and morphology. Normal
bilateral uncovertebral and apophyseal joints. Normal central canal
and intervertebral neuroforamina.
C5-6: Normal endplates. Metallic density operative material within
the intervertebral disc space. Normal bilateral uncovertebral and
apophyseal joints. Normal central canal and intervertebral
neuroforamina.
C6-7: Normal endplates. Metallic density operative material within
the intervertebral disc space. Normal bilateral uncovertebral and
apophyseal joints. Normal central canal and intervertebral
neuroforamina.
C7-T1: Normal endplates. Normal disc height and morphology. Normal
bilateral uncovertebral and apophyseal joints. Normal central canal
and intervertebral neuroforamina.

Upper chest: Negative.

Other: None.
IMPRESSION: 1. Postoperative changes at the levels of C5-C6 and C6-C7.
2. No acute osseous injury of the cervical spine.
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
## 2021-07-05 ENCOUNTER — Other Ambulatory Visit: Payer: Self-pay | Admitting: Chiropractor

## 2021-07-05 DIAGNOSIS — M542 Cervicalgia: Secondary | ICD-10-CM

## 2021-07-22 ENCOUNTER — Other Ambulatory Visit: Payer: BC Managed Care – PPO

## 2021-07-31 ENCOUNTER — Other Ambulatory Visit: Payer: BC Managed Care – PPO

## 2021-08-03 ENCOUNTER — Ambulatory Visit
Admission: RE | Admit: 2021-08-03 | Discharge: 2021-08-03 | Disposition: A | Discharge status: Home / Self Care | Payer: BC Managed Care – PPO | Source: Ambulatory Visit | Attending: Chiropractor | Admitting: Chiropractor

## 2021-08-03 DIAGNOSIS — M542 Cervicalgia: Secondary | ICD-10-CM

## 2021-08-13 ENCOUNTER — Other Ambulatory Visit: Payer: Self-pay | Admitting: Chiropractic Medicine

## 2021-08-13 DIAGNOSIS — M25512 Pain in left shoulder: Secondary | ICD-10-CM

## 2021-08-13 DIAGNOSIS — G8929 Other chronic pain: Secondary | ICD-10-CM

## 2021-08-26 ENCOUNTER — Ambulatory Visit
Admission: RE | Admit: 2021-08-26 | Discharge: 2021-08-26 | Disposition: A | Discharge status: Home / Self Care | Payer: BC Managed Care – PPO | Source: Ambulatory Visit | Attending: Chiropractic Medicine | Admitting: Chiropractic Medicine

## 2021-08-26 DIAGNOSIS — G8929 Other chronic pain: Secondary | ICD-10-CM

## 2021-09-25 ENCOUNTER — Other Ambulatory Visit: Payer: Self-pay

## 2021-09-25 ENCOUNTER — Emergency Department (HOSPITAL_BASED_OUTPATIENT_CLINIC_OR_DEPARTMENT_OTHER)
Admission: EM | Admit: 2021-09-25 | Discharge: 2021-09-25 | Disposition: A | Discharge status: Home / Self Care | Payer: BC Managed Care – PPO | Attending: Emergency Medicine | Admitting: Emergency Medicine

## 2021-09-25 ENCOUNTER — Emergency Department (HOSPITAL_BASED_OUTPATIENT_CLINIC_OR_DEPARTMENT_OTHER): Payer: BC Managed Care – PPO | Admitting: Radiology

## 2021-09-25 ENCOUNTER — Encounter (HOSPITAL_BASED_OUTPATIENT_CLINIC_OR_DEPARTMENT_OTHER): Payer: Self-pay | Admitting: Emergency Medicine

## 2021-09-25 DIAGNOSIS — R52 Pain, unspecified: Secondary | ICD-10-CM

## 2021-09-25 DIAGNOSIS — M79672 Pain in left foot: Secondary | ICD-10-CM | POA: Diagnosis present

## 2021-09-25 DIAGNOSIS — L03032 Cellulitis of left toe: Secondary | ICD-10-CM | POA: Insufficient documentation

## 2021-09-25 DIAGNOSIS — I10 Essential (primary) hypertension: Secondary | ICD-10-CM | POA: Insufficient documentation

## 2021-09-25 MED ORDER — FAMOTIDINE 20 MG PO TABS: 40.0000 mg | ORAL_TABLET | Freq: Every day | ORAL | 0 refills | Status: DC

## 2021-09-25 MED ORDER — IBUPROFEN 800 MG PO TABS: 800.0000 mg | ORAL_TABLET | Freq: Three times a day (TID) | ORAL | 0 refills | Status: DC

## 2021-09-25 MED ORDER — FAMOTIDINE 20 MG PO TABS: 40.0000 mg | ORAL_TABLET | Freq: Every day | ORAL | 0 refills | Status: AC

## 2021-09-25 MED ORDER — DOXYCYCLINE HYCLATE 100 MG PO CAPS: 100.0000 mg | ORAL_CAPSULE | Freq: Two times a day (BID) | ORAL | 0 refills | Status: DC

## 2021-09-25 NOTE — ED Triage Notes (Signed)
Reports left foot pain that started yesterday.  Denies any injury.  Unable to bear weight.

## 2021-09-25 NOTE — Discharge Instructions (Addendum)
Follow-up with the podiatrist you saw for your surgery.  Call them this afternoon to set up a follow-up appointment.    Take doxycycline twice daily for 10 days.  This is an antibiotic that treats infection.    Additionally take ibuprofen 800 mg up to 3 times daily as needed for pain.  Take it with food and water as this decreases the risk of getting ulcers.  I also advise taking Pepcid on the days you take ibuprofen as this helps protect the stomach lining. Take pepcid once daily 30 minutes before first meal of the day.    Return to the ED for fever, severe pain, inability to tolerate oral medicine.

## 2021-09-25 NOTE — ED Provider Notes (Signed)
[facility] Provider Note   CSN: [id number] Arrival date & time: 09/25/21  1124     History Chief Complaint  Patient presents with   Foot Pain    Martha Davis is a 50 y.o. female.  HPI  Patient with noncontributory history presents with left foot pain.  Happened acutely yesterday, the pain itself is been constant.  It worsened earlier this morning, she has pain when she bears foot on the sole of her foot.  The pain is primarily to the great toe, it is constant and feels like a throbbing pain but it is worse whenever she bears weight.  No history of the same, not having any fevers at home.    Past Medical History:  Diagnosis Date   Achalasia and cardiospasm    Anemia    Chest pain, unspecified    Diaphragmatic hernia without mention of obstruction or gangrene    Dysphagia, unspecified(787.20)    Hypertrophy of adenoids alone    Unspecified essential hypertension     Patient Active Problem List   Diagnosis Date Noted   Fibroids 02/12/2012   Menometrorrhagia 02/12/2012   Anemia 02/12/2012   HTN (hypertension) 10/01/2011    Past Surgical History:  Procedure Laterality Date   NECK SURGERY       OB History     Gravida  1   Para  1   Term  1   Preterm      AB      Living  1      SAB      IAB      Ectopic      Multiple      Live Births              Family History  Problem Relation Age of Onset   Diabetes Father    Hypertension Mother    Stroke Mother    Breast cancer Neg Hx     Social History   Tobacco Use   Smoking status: Never   Smokeless tobacco: Never  Vaping Use   Vaping Use: Never used  Substance Use Topics   Alcohol use: No   Drug use: No    Home Medications Prior to Admission medications   Medication Sig Start Date End Date Taking? Authorizing Provider  chlorhexidine (HIBICLENS) 4 % external liquid Apply topically daily as needed. 12/23/20   Volney American, PA-C  doxycycline  (VIBRAMYCIN) 100 MG capsule Take 1 capsule (100 mg total) by mouth 2 (two) times daily. 09/25/21   Deno Etienne, DO  famotidine (PEPCID) 20 MG tablet Take 2 tablets (40 mg total) by mouth daily. 09/25/21   Deno Etienne, DO  ibuprofen (ADVIL) 800 MG tablet Take 1 tablet (800 mg total) by mouth 3 (three) times daily. 09/25/21   Deno Etienne, DO  predniSONE (DELTASONE) 20 MG tablet Take 2 tablets (40 mg total) by mouth daily. 07/31/20   Vanessa Kick, MD    Allergies    Patient has no known allergies.  Review of Systems   Review of Systems  Constitutional:  Negative for fever.  Musculoskeletal:  Positive for arthralgias, gait problem and myalgias.   Physical Exam Updated Vital Signs BP 135/73 (BP Location: Right Arm)   Pulse 66   Temp 98.4 F (36.9 C) (Oral)   Resp 18   Ht 5\' 7"  (1.702 m)   Wt 108.9 kg   SpO2 100%   BMI 37.59 kg/m   Physical Exam Vitals and  nursing note reviewed. Exam conducted with a chaperone present.  Constitutional:      General: She is not in acute distress.    Appearance: Normal appearance.  HENT:     Head: Normocephalic and atraumatic.  Eyes:     General: No scleral icterus.    Extraocular Movements: Extraocular movements intact.     Pupils: Pupils are equal, round, and reactive to light.  Cardiovascular:     Pulses: Normal pulses.     Comments: DP and PT 2+ Musculoskeletal:        General: Tenderness present.     Comments: Patient is able to tolerate passive range of motion to the foot, able to do active range of motion although is limited secondary to pain.  With flexion extended to the toes, no pain to the knee.  There is a palpable firm area to the lateral aspect of the great toe.  Not particularly mobile, no fluctuance.  There is some swelling to the great toe as well as some erythema, no point tenderness.  Skin:    General: Skin is warm.     Capillary Refill: Capillary refill takes less than 2 seconds.     Coloration: Skin is not jaundiced.      Findings: Erythema present.  Neurological:     Mental Status: She is alert. Mental status is at baseline.     Coordination: Coordination normal.    ED Results / Procedures / Treatments   Labs (all labs ordered are listed, but only abnormal results are displayed) Labs Reviewed - No data to display  EKG None  Radiology DG Foot Complete Left  Result Date: 09/25/2021 CLINICAL DATA:  Left foot meet pain beginning yesterday. Unable to bear weight. EXAM: LEFT FOOT - COMPLETE 3+ VIEW COMPARISON:  04/08/2021 FINDINGS: Hallux valgus deformity of the great toe with moderate spurring. Interval healed right distal fifth metatarsal fracture. Rounded metallic density foreign body seen on the skin surface of the plantar aspect of the hindfoot. IMPRESSION: 1. No acute fracture or dislocation of the left foot. 2. 2 mm rounded metallic density foreign body noted on the plantar cutaneous surface of the hindfoot. Please correlate with physical exam to exclude imbedded foreign body. Electronically Signed   By: Miachel Roux M.D.   On: 09/25/2021 11:57    Procedures Procedures   Medications Ordered in ED Medications - No data to display  ED Course  I have reviewed the triage vital signs and the nursing notes.  Pertinent labs & imaging results that were available during my care of the patient were reviewed by me and considered in my medical decision making (see chart for details).    MDM Rules/Calculators/A&P                           Stable vitals, nontoxic-appearing.  No mechanical injury to the foot, radiograph negative for any fractures or dislocations.  She is neurovascular intact with strong pulses and good cap refill.  She does have a firm area that was a possible foreign body per the radiograph of the toe, patient states its been there for years.  Not painful, do not suspect this is related to her current presentation.  Gout is a possibility, the erythema could also be consistent with an early  cellulitis.  I doubt a septic joint given she is able to tolerate range of motion.   Will discharge with doxycycline and have her follow-up with her PCP.  Patient discharged in stable condition with supportive brace, advised to follow-up with podiatry.  Discussed HPI, physical exam and plan of care for this patient with attending Deno Etienne. The attending physician evaluated this patient as part of a shared visit and agrees with plan of care.   Final Clinical Impression(s) / ED Diagnoses Final diagnoses:  Pain  Cellulitis of toe of left foot    Rx / DC Orders ED Discharge Orders          Ordered    doxycycline (VIBRAMYCIN) 100 MG capsule  2 times daily,   Status:  Discontinued        09/25/21 1425    ibuprofen (ADVIL) 800 MG tablet  3 times daily,   Status:  Discontinued        09/25/21 1425    famotidine (PEPCID) 20 MG tablet  Daily,   Status:  Discontinued        09/25/21 1425    doxycycline (VIBRAMYCIN) 100 MG capsule  2 times daily        09/25/21 1440    famotidine (PEPCID) 20 MG tablet  Daily        09/25/21 1440    ibuprofen (ADVIL) 800 MG tablet  3 times daily        09/25/21 [place], Yitzhak Awan, PA-C 09/25/21 [place], [place], DO 09/26/21 [phone]

## 2021-09-27 ENCOUNTER — Other Ambulatory Visit: Payer: Self-pay

## 2021-09-27 ENCOUNTER — Encounter: Payer: Self-pay | Admitting: Podiatry

## 2021-09-27 ENCOUNTER — Ambulatory Visit (INDEPENDENT_AMBULATORY_CARE_PROVIDER_SITE_OTHER): Payer: BC Managed Care – PPO | Admitting: Podiatry

## 2021-09-27 DIAGNOSIS — M7742 Metatarsalgia, left foot: Secondary | ICD-10-CM

## 2021-09-27 DIAGNOSIS — M7752 Other enthesopathy of left foot: Secondary | ICD-10-CM | POA: Diagnosis not present

## 2021-09-27 MED ORDER — METHYLPREDNISOLONE 4 MG PO TBPK: ORAL_TABLET | ORAL | 0 refills | Status: DC

## 2021-09-27 MED ORDER — MELOXICAM 15 MG PO TABS: 15.0000 mg | ORAL_TABLET | Freq: Every day | ORAL | 0 refills | Status: DC

## 2021-10-03 NOTE — Progress Notes (Signed)
Subjective:  Patient ID: Martha Davis, female    DOB: [birth year],  MRN: [id number]  Chief Complaint  Patient presents with   Foot Pain    Left foot pain     50 y.o. female presents with the above complaint.  Patient presents with pain to the left metatarsal capsulitis.  Patient states been hurting in the ball the foot.  She is having metatarsalgia type of pain.  She states it starts in the third metatarsal phalangeal joint.  She states painful to walk on.  She has not been able to walk on since last Wednesday.  He came out of nowhere he went to the ER they said nothing was broken.  She denies any other acute complaints.  She has not seen anyone as prior to seeing me.   Review of Systems: Negative except as noted in the HPI. Denies N/V/F/Ch.  Past Medical History:  Diagnosis Date   Achalasia and cardiospasm    Anemia    Chest pain, unspecified    Diaphragmatic hernia without mention of obstruction or gangrene    Dysphagia, unspecified(787.20)    Hypertrophy of adenoids alone    Unspecified essential hypertension     Current Outpatient Medications:    meloxicam (MOBIC) 15 MG tablet, Take 1 tablet (15 mg total) by mouth daily., Disp: 30 tablet, Rfl: 0   methylPREDNISolone (MEDROL DOSEPAK) 4 MG TBPK tablet, Take as directed, Disp: 21 each, Rfl: 0   chlorhexidine (HIBICLENS) 4 % external liquid, Apply topically daily as needed., Disp: 120 mL, Rfl: 0   doxycycline (VIBRAMYCIN) 100 MG capsule, Take 1 capsule (100 mg total) by mouth 2 (two) times daily., Disp: 20 capsule, Rfl: 0   famotidine (PEPCID) 20 MG tablet, Take 2 tablets (40 mg total) by mouth daily., Disp: 30 tablet, Rfl: 0   ibuprofen (ADVIL) 800 MG tablet, Take 1 tablet (800 mg total) by mouth 3 (three) times daily., Disp: 21 tablet, Rfl: 0   predniSONE (DELTASONE) 20 MG tablet, Take 2 tablets (40 mg total) by mouth daily., Disp: 10 tablet, Rfl: 0  Social History   Tobacco Use  Smoking Status Never  Smokeless Tobacco  Never    No Known Allergies Objective:  There were no vitals filed for this visit. There is no height or weight on file to calculate BMI. Constitutional Well developed. Well nourished.  Vascular Dorsalis pedis pulses palpable bilaterally. Posterior tibial pulses palpable bilaterally. Capillary refill normal to all digits.  No cyanosis or clubbing noted. Pedal hair growth normal.  Neurologic Normal speech. Oriented to person, place, and time. Epicritic sensation to light touch grossly present bilaterally.  Dermatologic Nails well groomed and normal in appearance. No open wounds. No skin lesions.  Orthopedic: Pain on palpation left third metatarsophalangeal joint.  Pain with range of motion of the MPJ joint.  No extensor or flexor tendinitis noted.  No plantar plate rupture noted.  No deep intra-articular MTPJ joint pain noted.3   Radiographs: IMPRESSION: 1. No acute fracture or dislocation of the left foot. 2. 2 mm rounded metallic density foreign body noted on the plantar cutaneous surface of the hindfoot. Please correlate with physical exam to exclude imbedded foreign body.  However does not correlate clinically as her pain is in the forefoot Assessment:   1. Capsulitis of metatarsophalangeal (MTP) joint of left foot   2. Metatarsalgia, left foot    Plan:  Patient was evaluated and treated and all questions answered.  Left third metatarsophalangeal joint capsulitis with underlying metatarsalgia -  I explained the patient the etiology of capsulitis and various treatment options were discussed.  This is likely attributing to the underlying metatarsalgia that she is experiencing especially with compensation.  I discussed my findings and I believe patient will benefit from a steroid injection to the MTP joint of the third to help decrease the pain and therefore return to normal gait.  She states understanding would like to proceed with steroid injection -A steroid injection was  performed at Left third metatarsal phalangeal joint using 1% plain Lidocaine and 10 mg of Kenalog. This was well tolerated. -Shoe gear modification was discussed.   No follow-ups on file.

## 2022-04-17 ENCOUNTER — Emergency Department (HOSPITAL_COMMUNITY): Payer: Medicaid Other

## 2022-04-17 ENCOUNTER — Encounter (HOSPITAL_COMMUNITY): Payer: Self-pay | Admitting: Emergency Medicine

## 2022-04-17 ENCOUNTER — Emergency Department (HOSPITAL_COMMUNITY)
Admission: EM | Admit: 2022-04-17 | Discharge: 2022-04-17 | Disposition: A | Discharge status: Home / Self Care | Payer: Medicaid Other | Attending: Emergency Medicine | Admitting: Emergency Medicine

## 2022-04-17 DIAGNOSIS — I10 Essential (primary) hypertension: Secondary | ICD-10-CM | POA: Insufficient documentation

## 2022-04-17 DIAGNOSIS — R030 Elevated blood-pressure reading, without diagnosis of hypertension: Secondary | ICD-10-CM

## 2022-04-17 DIAGNOSIS — R002 Palpitations: Secondary | ICD-10-CM | POA: Insufficient documentation

## 2022-04-17 LAB — CBC WITH DIFFERENTIAL/PLATELET
Abs Immature Granulocytes: 0.02 10*3/uL (ref 0.00–0.07)
Basophils Absolute: 0 10*3/uL (ref 0.0–0.1)
Basophils Relative: 1 %
Eosinophils Absolute: 0.2 10*3/uL (ref 0.0–0.5)
Eosinophils Relative: 2 %
HCT: 36.8 % (ref 36.0–46.0)
Hemoglobin: 11.8 g/dL — ABNORMAL LOW (ref 12.0–15.0)
Immature Granulocytes: 0 %
Lymphocytes Relative: 30 %
Lymphs Abs: 2.1 10*3/uL (ref 0.7–4.0)
MCH: 31 pg (ref 26.0–34.0)
MCHC: 32.1 g/dL (ref 30.0–36.0)
MCV: 96.6 fL (ref 80.0–100.0)
Monocytes Absolute: 0.7 10*3/uL (ref 0.1–1.0)
Monocytes Relative: 10 %
Neutro Abs: 4.1 10*3/uL (ref 1.7–7.7)
Neutrophils Relative %: 57 %
Platelets: 448 10*3/uL — ABNORMAL HIGH (ref 150–400)
RBC: 3.81 MIL/uL — ABNORMAL LOW (ref 3.87–5.11)
RDW: 14.1 % (ref 11.5–15.5)
WBC: 7 10*3/uL (ref 4.0–10.5)
nRBC: 0 % (ref 0.0–0.2)

## 2022-04-17 LAB — BASIC METABOLIC PANEL
Anion gap: 7 (ref 5–15)
BUN: 12 mg/dL (ref 6–20)
CO2: 26 mmol/L (ref 22–32)
Calcium: 8.9 mg/dL (ref 8.9–10.3)
Chloride: 108 mmol/L (ref 98–111)
Creatinine, Ser: 0.79 mg/dL (ref 0.44–1.00)
GFR, Estimated: >60 mL/min (ref >60)
Glucose, Bld: 98 mg/dL (ref 70–99)
Potassium: 3.5 mmol/L (ref 3.5–5.1)
Sodium: 141 mmol/L (ref 135–145)

## 2022-04-17 LAB — TROPONIN I (HIGH SENSITIVITY)
Troponin I (High Sensitivity): 2 ng/L (ref <18)
Troponin I (High Sensitivity): 3 ng/L (ref <18)

## 2022-04-17 NOTE — Discharge Instructions (Addendum)
Your work-up was overall reassuring.  Your heart enzymes, x-rays, EKG did not show any concerns of a heart attack, or concerning rhythms.  Plan is given a referral to cardiology.  You should receive a call from their clinic in the next few days if not please give the clinic a call to get this scheduled.  If you have any worsening symptoms please return to the emergency room for evaluation.  Please maintain a blood pressure diary and follow-up with your primary care provider.

## 2022-04-17 NOTE — ED Triage Notes (Signed)
Patient here from work feeling "unwell". Reports hypertension for the past 2 days.

## 2022-04-17 NOTE — ED Notes (Signed)
Below order not completed by EW. 

## 2022-04-17 NOTE — ED Provider Triage Note (Signed)
Emergency Medicine Provider Triage Evaluation Note  Martha Davis , a 51 y.o. female  was evaluated in triage.  Pt complains of elevated blood pressure and not feeling well for the past 2 days.  She states since today she has had more pronounced heartbeat and chest discomfort.  Denies shortness of breath, weakness.  Review of Systems  Positive: As above  Negative: As above  Physical Exam  BP (!) 174/92 (BP Location: Right Arm)   Pulse 64   Temp 98.4 F (36.9 C) (Oral)   Resp 18   SpO2 100%  Gen:   Awake, no distress   Resp:  Normal effort  MSK:   Moves extremities without difficulty  Other:    Medical Decision Making  Medically screening exam initiated at 2:48 PM.  Appropriate orders placed.  Grizel L Glazier was informed that the remainder of the evaluation will be completed by another provider, this initial triage assessment does not replace that evaluation, and the importance of remaining in the ED until their evaluation is complete.     Evlyn Courier, PA-C 04/17/22 1448

## 2022-04-30 ENCOUNTER — Encounter: Payer: Self-pay | Admitting: *Deleted

## 2022-10-25 ENCOUNTER — Ambulatory Visit (HOSPITAL_COMMUNITY)
Admission: EM | Admit: 2022-10-25 | Discharge: 2022-10-25 | Disposition: A | Discharge status: Home / Self Care | Payer: Self-pay | Attending: Physician Assistant | Admitting: Physician Assistant

## 2022-10-25 ENCOUNTER — Encounter (HOSPITAL_COMMUNITY): Payer: Self-pay

## 2022-10-25 DIAGNOSIS — R197 Diarrhea, unspecified: Secondary | ICD-10-CM | POA: Insufficient documentation

## 2022-10-25 DIAGNOSIS — J9801 Acute bronchospasm: Secondary | ICD-10-CM | POA: Insufficient documentation

## 2022-10-25 DIAGNOSIS — J069 Acute upper respiratory infection, unspecified: Secondary | ICD-10-CM | POA: Insufficient documentation

## 2022-10-25 DIAGNOSIS — R051 Acute cough: Secondary | ICD-10-CM | POA: Insufficient documentation

## 2022-10-25 DIAGNOSIS — Z1152 Encounter for screening for COVID-19: Secondary | ICD-10-CM | POA: Insufficient documentation

## 2022-10-25 DIAGNOSIS — Z8616 Personal history of COVID-19: Secondary | ICD-10-CM | POA: Insufficient documentation

## 2022-10-25 LAB — POC INFLUENZA A AND B ANTIGEN (URGENT CARE ONLY)
INFLUENZA A ANTIGEN, POC: NEGATIVE
INFLUENZA B ANTIGEN, POC: NEGATIVE

## 2022-10-25 LAB — BASIC METABOLIC PANEL
Anion gap: 12 (ref 5–15)
BUN: 12 mg/dL (ref 6–20)
CO2: 20 mmol/L — ABNORMAL LOW (ref 22–32)
Calcium: 8.3 mg/dL — ABNORMAL LOW (ref 8.9–10.3)
Chloride: 106 mmol/L (ref 98–111)
Creatinine, Ser: 1.15 mg/dL — ABNORMAL HIGH (ref 0.44–1.00)
GFR, Estimated: 58 mL/min — ABNORMAL LOW (ref >60)
Glucose, Bld: 95 mg/dL (ref 70–99)
Potassium: 3.6 mmol/L (ref 3.5–5.1)
Sodium: 138 mmol/L (ref 135–145)

## 2022-10-25 MED ORDER — IPRATROPIUM-ALBUTEROL 0.5-2.5 (3) MG/3ML IN SOLN
3.0000 mL | Freq: Once | RESPIRATORY_TRACT | Status: AC
  Administered 2022-10-25: 3 mL via RESPIRATORY_TRACT

## 2022-10-25 MED ORDER — ALBUTEROL SULFATE HFA 108 (90 BASE) MCG/ACT IN AERS: 1.0000 | INHALATION_SPRAY | Freq: Four times a day (QID) | RESPIRATORY_TRACT | 0 refills | Status: AC | PRN

## 2022-10-25 MED ORDER — FLUTICASONE PROPIONATE 50 MCG/ACT NA SUSP: 1.0000 | Freq: Every day | NASAL | 0 refills | Status: AC

## 2022-10-25 MED ORDER — PROMETHAZINE-DM 6.25-15 MG/5ML PO SYRP
5.0000 mL | ORAL_SOLUTION | Freq: Two times a day (BID) | ORAL | 0 refills | Status: AC | PRN
Start: 2022-10-25 — End: ?

## 2022-10-25 MED ORDER — IPRATROPIUM-ALBUTEROL 0.5-2.5 (3) MG/3ML IN SOLN
RESPIRATORY_TRACT | Status: AC
  Filled 2022-10-25: qty 3

## 2022-10-25 NOTE — Discharge Instructions (Addendum)
You tested negative for the flu.  Use albuterol every 4-6 hours as needed for shortness of breath and coughing fits.  Use Promethazine DM for cough.  This will make you sleepy so do not drive or drink alcohol with taking it.  Use Flonase daily to help with your congestion.  I recommend over-the-counter medication including Tylenol, Mucinex.  I will contact you if you are positive for COVID and we need to start antiviral medication.  Make sure that you are resting and drinking plenty of fluid.  If your symptoms or not improving please return for reevaluation.  If anything worsens and you have high fever not respond to medication, chest pain, shortness of breath, nausea, vomiting, weakness you need to be seen immediately.

## 2022-10-25 NOTE — ED Triage Notes (Signed)
Pt is here for cough, sore throat, congestion, chills, body aches, back pain , headache, low energy , loss of appetite except 2days

## 2022-10-25 NOTE — ED Provider Notes (Signed)
[place]    CSN: [id number] Arrival date & time: 10/25/22  1325      History   Chief Complaint Chief Complaint  Patient presents with   Cough   Diarrhea    HPI Martha Davis is a 51 y.o. female.   Patient presents today with a 36 to 48-hour history of URI symptoms including cough, sore throat, body aches, chills, headache, fatigue, malaise, decreased appetite, diarrhea, nausea.  Denies any chest pain, vomiting, abdominal pain.  She has tried over-the-counter medications without improvement of symptoms.  She has not had an influenza vaccine.  She has had COVID-19 vaccination.  She has had COVID approximately 1 year ago.  Denies any known sick contacts.  She denies any history of asthma, COPD, allergies, smoking.  Denies any recent antibiotics or steroids.  She is having difficulty with daily activities including her ADL as result of the fatigue and feeling poorly.    Past Medical History:  Diagnosis Date   Achalasia and cardiospasm    Anemia    Chest pain, unspecified    Diaphragmatic hernia without mention of obstruction or gangrene    Dysphagia, unspecified(787.20)    Hypertrophy of adenoids alone    Unspecified essential hypertension     Patient Active Problem List   Diagnosis Date Noted   Fibroids 02/12/2012   Menometrorrhagia 02/12/2012   Anemia 02/12/2012   HTN (hypertension) 10/01/2011    Past Surgical History:  Procedure Laterality Date   NECK SURGERY      OB History     Gravida  1   Para  1   Term  1   Preterm      AB      Living  1      SAB      IAB      Ectopic      Multiple      Live Births               Home Medications    Prior to Admission medications   Medication Sig Start Date End Date Taking? Authorizing Provider  albuterol (VENTOLIN HFA) 108 (90 Base) MCG/ACT inhaler Inhale 1-2 puffs into the lungs every 6 (six) hours as needed for wheezing or shortness of breath. 10/25/22  Yes Mccall Lomax K, PA-C   fluticasone (FLONASE) 50 MCG/ACT nasal spray Place 1 spray into both nostrils daily. 10/25/22  Yes Coen Miyasato K, PA-C  promethazine-dextromethorphan (PROMETHAZINE-DM) 6.25-15 MG/5ML syrup Take 5 mLs by mouth 2 (two) times daily as needed for cough. 10/25/22  Yes Yalanda Soderman K, PA-C  famotidine (PEPCID) 20 MG tablet Take 2 tablets (40 mg total) by mouth daily. 09/25/21   Deno Etienne, DO    Family History Family History  Problem Relation Age of Onset   Diabetes Father    Hypertension Mother    Stroke Mother    Breast cancer Neg Hx     Social History Social History   Tobacco Use   Smoking status: Never   Smokeless tobacco: Never  Vaping Use   Vaping Use: Never used  Substance Use Topics   Alcohol use: No   Drug use: No     Allergies   Patient has no known allergies.   Review of Systems Review of Systems  Constitutional:  Positive for activity change, fatigue and fever. Negative for appetite change.  HENT:  Positive for congestion and sore throat. Negative for sinus pressure and sneezing.   Respiratory:  Positive for  cough and shortness of breath.   Cardiovascular:  Negative for chest pain.  Gastrointestinal:  Positive for diarrhea and nausea. Negative for abdominal pain and vomiting.  Musculoskeletal:  Positive for arthralgias and myalgias.  Neurological:  Positive for headaches. Negative for dizziness and light-headedness.     Physical Exam Triage Vital Signs ED Triage Vitals [10/25/22 1600]  Enc Vitals Group     BP 122/80     Pulse Rate 81     Resp 12     Temp 99.4 F (37.4 C)     Temp Source Oral     SpO2 95 %     Weight      Height      Head Circumference      Peak Flow      Pain Score      Pain Loc      Pain Edu?      Excl. in [place]?    No data found.  Updated Vital Signs BP 122/80 (BP Location: Right Arm)   Pulse 81   Temp 99.4 F (37.4 C) (Oral)   Resp 12   SpO2 95%   Visual Acuity Right Eye Distance:   Left Eye Distance:   Bilateral  Distance:    Right Eye Near:   Left Eye Near:    Bilateral Near:     Physical Exam Vitals reviewed.  Constitutional:      General: She is awake. She is not in acute distress.    Appearance: Normal appearance. She is well-developed. She is not ill-appearing.     Comments: Very pleasant female appears stated age in no acute distress sitting comfortably in exam room  HENT:     Head: Normocephalic and atraumatic.     Right Ear: Tympanic membrane, ear canal and external ear normal. Tympanic membrane is not erythematous or bulging.     Left Ear: Tympanic membrane, ear canal and external ear normal. Tympanic membrane is not erythematous or bulging.     Nose:     Right Sinus: No maxillary sinus tenderness or frontal sinus tenderness.     Left Sinus: No maxillary sinus tenderness or frontal sinus tenderness.     Mouth/Throat:     Pharynx: Uvula midline. Posterior oropharyngeal erythema present. No oropharyngeal exudate.  Cardiovascular:     Rate and Rhythm: Normal rate and regular rhythm.     Heart sounds: Normal heart sounds, S1 normal and S2 normal. No murmur heard. Pulmonary:     Effort: Pulmonary effort is normal.     Breath sounds: Wheezing present. No rhonchi or rales.     Comments: Scattered wheezing Psychiatric:        Behavior: Behavior is cooperative.      UC Treatments / Results  Labs (all labs ordered are listed, but only abnormal results are displayed) Labs Reviewed  SARS CORONAVIRUS 2 (TAT 6-24 HRS)  BASIC METABOLIC PANEL  POC INFLUENZA A AND B ANTIGEN (URGENT CARE ONLY)    EKG   Radiology No results found.  Procedures Procedures (including critical care time)  Medications Ordered in UC Medications  ipratropium-albuterol (DUONEB) 0.5-2.5 (3) MG/3ML nebulizer solution 3 mL (3 mLs Nebulization Given 10/25/22 1624)    Initial Impression / Assessment and Plan / UC Course  I have reviewed the triage vital signs and the nursing notes.  Pertinent labs &  imaging results that were available during my care of the patient were reviewed by me and considered in my medical decision making (see chart  for details).     Patient is well-appearing, afebrile, nontoxic, nontachycardic.  Suspect viral etiology given short duration of symptoms.  No evidence of acute infection on physical exam that would warrant initiation of antibiotics.  Patient did have wheezing that resolved with in office DuoNeb.  Albuterol was sent to pharmacy to be used every 4-6 hours as needed for shortness of breath and coughing fits.  Flu testing was negative in clinic.  COVID testing is pending.  Patient is a candidate for antivirals given her age and she is interested in initiating Paxlovid if positive for COVID.  We do not have a recent kidney function on file so we will obtain this today in order to appropriately dose Paxlovid if she is positive.  She was encouraged to use conservative treatment measures including rest, fluids.  She is to use Tylenol and Mucinex for additional symptom relief.  She was prescribed Flonase.  Recommended Promethazine DM for cough.  Discussed that this can be sedating and she is not to drive or drink alcohol while taking it.  Discussed that if her symptoms not improving within a week she is to return for reevaluation.  If she has any worsening or changing symptoms she needs to be seen immediately including chest pain, shortness of breath, worsening cough, fever, nausea/vomiting interfere with oral intake.  Strict return precautions given.  Work excuse note with current CDC return to work guidelines based on COVID test result provided during visit.  Final Clinical Impressions(s) / UC Diagnoses   Final diagnoses:  Upper respiratory tract infection, unspecified type  Acute cough  Bronchospasm     Discharge Instructions      You tested negative for the flu.  Use albuterol every 4-6 hours as needed for shortness of breath and coughing fits.  Use Promethazine  DM for cough.  This will make you sleepy so do not drive or drink alcohol with taking it.  Use Flonase daily to help with your congestion.  I recommend over-the-counter medication including Tylenol, Mucinex.  I will contact you if you are positive for COVID and we need to start antiviral medication.  Make sure that you are resting and drinking plenty of fluid.  If your symptoms or not improving please return for reevaluation.  If anything worsens and you have high fever not respond to medication, chest pain, shortness of breath, nausea, vomiting, weakness you need to be seen immediately.     ED Prescriptions     Medication Sig Dispense Auth. Provider   albuterol (VENTOLIN HFA) 108 (90 Base) MCG/ACT inhaler Inhale 1-2 puffs into the lungs every 6 (six) hours as needed for wheezing or shortness of breath. 18 g Mckinley Olheiser K, PA-C   promethazine-dextromethorphan (PROMETHAZINE-DM) 6.25-15 MG/5ML syrup Take 5 mLs by mouth 2 (two) times daily as needed for cough. 118 mL Eliaz Fout K, PA-C   fluticasone (FLONASE) 50 MCG/ACT nasal spray Place 1 spray into both nostrils daily. 16 g Karmel Patricelli K, PA-C      PDMP not reviewed this encounter.   Terrilee Croak, PA-C 10/25/22 1656

## 2022-10-26 LAB — SARS CORONAVIRUS 2 (TAT 6-24 HRS): SARS Coronavirus 2: NEGATIVE

## 2023-01-22 ENCOUNTER — Emergency Department (HOSPITAL_BASED_OUTPATIENT_CLINIC_OR_DEPARTMENT_OTHER)
Admission: EM | Admit: 2023-01-22 | Discharge: 2023-01-22 | Disposition: A | Discharge status: Home / Self Care | Payer: BLUE CROSS/BLUE SHIELD | Attending: Emergency Medicine | Admitting: Emergency Medicine

## 2023-01-22 ENCOUNTER — Other Ambulatory Visit: Payer: Self-pay

## 2023-01-22 ENCOUNTER — Emergency Department (HOSPITAL_BASED_OUTPATIENT_CLINIC_OR_DEPARTMENT_OTHER): Payer: BLUE CROSS/BLUE SHIELD

## 2023-01-22 ENCOUNTER — Encounter (HOSPITAL_BASED_OUTPATIENT_CLINIC_OR_DEPARTMENT_OTHER): Payer: Self-pay

## 2023-01-22 DIAGNOSIS — T18108A Unspecified foreign body in esophagus causing other injury, initial encounter: Secondary | ICD-10-CM | POA: Diagnosis not present

## 2023-01-22 DIAGNOSIS — R1314 Dysphagia, pharyngoesophageal phase: Secondary | ICD-10-CM | POA: Insufficient documentation

## 2023-01-22 DIAGNOSIS — X58XXXA Exposure to other specified factors, initial encounter: Secondary | ICD-10-CM | POA: Insufficient documentation

## 2023-01-22 DIAGNOSIS — R09A2 Foreign body sensation, throat: Secondary | ICD-10-CM

## 2023-01-22 DIAGNOSIS — I10 Essential (primary) hypertension: Secondary | ICD-10-CM | POA: Insufficient documentation

## 2023-01-22 DIAGNOSIS — J029 Acute pharyngitis, unspecified: Secondary | ICD-10-CM | POA: Diagnosis present

## 2023-01-22 DIAGNOSIS — R1319 Other dysphagia: Secondary | ICD-10-CM

## 2023-01-22 LAB — COMPREHENSIVE METABOLIC PANEL
ALT: 12 U/L (ref 0–44)
AST: 16 U/L (ref 15–41)
Albumin: 4.2 g/dL (ref 3.5–5.0)
Alkaline Phosphatase: 93 U/L (ref 38–126)
Anion gap: 9 (ref 5–15)
BUN: 9 mg/dL (ref 6–20)
CO2: 24 mmol/L (ref 22–32)
Calcium: 8.9 mg/dL (ref 8.9–10.3)
Chloride: 104 mmol/L (ref 98–111)
Creatinine, Ser: 0.84 mg/dL (ref 0.44–1.00)
GFR, Estimated: >60 mL/min (ref >60)
Glucose, Bld: 98 mg/dL (ref 70–99)
Potassium: 3.9 mmol/L (ref 3.5–5.1)
Sodium: 137 mmol/L (ref 135–145)
Total Bilirubin: 0.5 mg/dL (ref 0.3–1.2)
Total Protein: 8.6 g/dL — ABNORMAL HIGH (ref 6.5–8.1)

## 2023-01-22 LAB — CBC WITH DIFFERENTIAL/PLATELET
Abs Immature Granulocytes: 0.03 10*3/uL (ref 0.00–0.07)
Basophils Absolute: 0 10*3/uL (ref 0.0–0.1)
Basophils Relative: 0 %
Eosinophils Absolute: 0.2 10*3/uL (ref 0.0–0.5)
Eosinophils Relative: 1 %
HCT: 36.2 % (ref 36.0–46.0)
Hemoglobin: 11.7 g/dL — ABNORMAL LOW (ref 12.0–15.0)
Immature Granulocytes: 0 %
Lymphocytes Relative: 19 %
Lymphs Abs: 2.1 10*3/uL (ref 0.7–4.0)
MCH: 30.3 pg (ref 26.0–34.0)
MCHC: 32.3 g/dL (ref 30.0–36.0)
MCV: 93.8 fL (ref 80.0–100.0)
Monocytes Absolute: 0.7 10*3/uL (ref 0.1–1.0)
Monocytes Relative: 6 %
Neutro Abs: 8.1 10*3/uL — ABNORMAL HIGH (ref 1.7–7.7)
Neutrophils Relative %: 74 %
Platelets: 481 10*3/uL — ABNORMAL HIGH (ref 150–400)
RBC: 3.86 MIL/uL — ABNORMAL LOW (ref 3.87–5.11)
RDW: 14.2 % (ref 11.5–15.5)
WBC: 11.1 10*3/uL — ABNORMAL HIGH (ref 4.0–10.5)
nRBC: 0 % (ref 0.0–0.2)

## 2023-01-22 LAB — LIPASE, BLOOD: Lipase: 28 U/L (ref 11–51)

## 2023-01-22 MED ORDER — PANTOPRAZOLE SODIUM 40 MG PO PACK: 40.0000 mg | PACK | Freq: Every day | ORAL | 0 refills | Status: AC

## 2023-01-22 MED ORDER — PANTOPRAZOLE SODIUM 40 MG IV SOLR
40.0000 mg | Freq: Once | INTRAVENOUS | Status: AC
  Administered 2023-01-22: 40 mg via INTRAVENOUS
  Filled 2023-01-22: qty 10

## 2023-01-22 MED ORDER — PANTOPRAZOLE SODIUM 40 MG PO PACK: 40.0000 mg | PACK | Freq: Every day | ORAL | 0 refills | Status: DC

## 2023-01-22 MED ORDER — IOHEXOL 300 MG/ML  SOLN
75.0000 mL | Freq: Once | INTRAMUSCULAR | Status: AC | PRN
  Administered 2023-01-22: 75 mL via INTRAVENOUS

## 2023-01-22 MED ORDER — SODIUM CHLORIDE 0.9 % IV SOLN: INTRAVENOUS | Status: DC

## 2023-01-22 MED ORDER — SUCRALFATE 1 GM/10ML PO SUSP: 1.0000 g | Freq: Three times a day (TID) | ORAL | 0 refills | Status: AC

## 2023-01-22 NOTE — ED Provider Notes (Addendum)
[facility] Provider Note   CSN: [id number] Arrival date & time: 01/22/23  1229     History  Chief Complaint  Patient presents with   Martha Davis is a 52 y.o. female.  Last evening patient had a piece of hot chicken get stuck in her throat.  It was stuck for about 5 minutes.  And then patient was able to get back up but since that time she feels like as if it burned her throat.  Patient stated something like this happened in the past.  Since that time patient says it is painful to swallow any liquids.  Saliva goes down okay but just the swallowing movement causes pain at the base of neck.  Patient records shows that in 2013 patient had dysphagia seen by [facility] gastroenterology had a normal upper endoscopy.  But patient seen again in 2017 in the ED for something similar to this occurring when it was a hot piece of salmon.  And patient seen in the ED and then referred to ear nose and throat her nose and throat did a nasopharyngeal scope.  Inset at the first part of the esophagus there were blisters consistent with second-degree burn.  Patient with no real follow-up since that time.  Past medical history significant for achalasia and cardiospasm dysphagia hypertension.  Patient is never used tobacco products.  Patient's blood pressure here today is 149/77 temperature 98.5 oxygen sats 100%.       Home Medications Prior to Admission medications   Medication Sig Start Date End Date Taking? Authorizing Provider  albuterol (VENTOLIN HFA) 108 (90 Base) MCG/ACT inhaler Inhale 1-2 puffs into the lungs every 6 (six) hours as needed for wheezing or shortness of breath. 10/25/22   Raspet, Derry Skill, PA-C  famotidine (PEPCID) 20 MG tablet Take 2 tablets (40 mg total) by mouth daily. 09/25/21   Deno Etienne, DO  fluticasone (FLONASE) 50 MCG/ACT nasal spray Place 1 spray into both nostrils daily. 10/25/22   Raspet, Derry Skill, PA-C   promethazine-dextromethorphan (PROMETHAZINE-DM) 6.25-15 MG/5ML syrup Take 5 mLs by mouth 2 (two) times daily as needed for cough. 10/25/22   Raspet, Derry Skill, PA-C      Allergies    Patient has no known allergies.    Review of Systems   Review of Systems  Constitutional:  Negative for chills and fever.  HENT:  Positive for trouble swallowing. Negative for ear pain and Martha throat.   Eyes:  Negative for pain and visual disturbance.  Respiratory:  Negative for cough and shortness of breath.   Cardiovascular:  Negative for chest pain and palpitations.  Gastrointestinal:  Negative for abdominal pain and vomiting.  Genitourinary:  Negative for dysuria and hematuria.  Musculoskeletal:  Negative for arthralgias and back pain.  Skin:  Negative for color change and rash.  Neurological:  Negative for seizures and syncope.  All other systems reviewed and are negative.   Physical Exam Updated Vital Signs BP (!) 148/68   Pulse 69   Temp 98.5 F (36.9 C) (Oral)   Resp 16   Ht 1.702 m (5\' 7" )   Wt 117.9 kg   SpO2 100%   BMI 40.72 kg/m  Physical Exam Vitals and nursing note reviewed.  Constitutional:      General: She is not in acute distress.    Appearance: Normal appearance. She is well-developed. She is not ill-appearing or toxic-appearing.  HENT:  Head: Normocephalic and atraumatic.     Mouth/Throat:     Mouth: Mucous membranes are moist.     Pharynx: Oropharynx is clear. No oropharyngeal exudate or posterior oropharyngeal erythema.     Comments: No evidence of any blisters or burns no foreign body visible. Eyes:     Conjunctiva/sclera: Conjunctivae normal.  Cardiovascular:     Rate and Rhythm: Normal rate and regular rhythm.     Heart sounds: No murmur heard. Pulmonary:     Effort: Pulmonary effort is normal. No respiratory distress.     Breath sounds: Normal breath sounds.  Abdominal:     Palpations: Abdomen is soft.     Tenderness: There is no abdominal tenderness.   Musculoskeletal:        General: No swelling.     Cervical back: Neck supple. No rigidity.  Lymphadenopathy:     Cervical: No cervical adenopathy.  Skin:    General: Skin is warm and dry.     Capillary Refill: Capillary refill takes less than 2 seconds.  Neurological:     General: No focal deficit present.     Mental Status: She is alert and oriented to person, place, and time.     Cranial Nerves: No cranial nerve deficit.     Sensory: No sensory deficit.     Motor: No weakness.  Psychiatric:        Mood and Affect: Mood normal.     ED Results / Procedures / Treatments   Labs (all labs ordered are listed, but only abnormal results are displayed) Labs Reviewed  CBC WITH DIFFERENTIAL/PLATELET - Abnormal; Notable for the following components:      Result Value   WBC 11.1 (*)    RBC 3.86 (*)    Hemoglobin 11.7 (*)    Platelets 481 (*)    Neutro Abs 8.1 (*)    All other components within normal limits  COMPREHENSIVE METABOLIC PANEL - Abnormal; Notable for the following components:   Total Protein 8.6 (*)    All other components within normal limits  LIPASE, BLOOD    EKG None  Radiology DG Chest Port 1 View  Result Date: 01/22/2023 CLINICAL DATA:  Throat pain after transient impaction of a chicken bolus last night. EXAM: PORTABLE CHEST 1 VIEW COMPARISON:  04/17/2022 FINDINGS: Lower cervical plate and screw fixator. Mild linear scarring at both lung bases. The lungs appear otherwise clear. Midthoracic spondylosis. Heart size within normal limits for AP technique. IMPRESSION: 1. Mild linear scarring at both lung bases. 2. Midthoracic spondylosis. 3. No pneumomediastinum or abnormal gas in the lower neck identified. Electronically Signed   By: Van Clines M.D.   On: 01/22/2023 13:31    Procedures Procedures    Medications Ordered in ED Medications  0.9 %  sodium chloride infusion ( Intravenous New Bag/Given 01/22/23 1334)  pantoprazole (PROTONIX) injection 40 mg (40  mg Intravenous Given 01/22/23 1323)  iohexol (OMNIPAQUE) 300 MG/ML solution 75 mL (75 mLs Intravenous Contrast Given 01/22/23 1419)    ED Course/ Medical Decision Making/ A&P                             Medical Decision Making Amount and/or Complexity of Data Reviewed Labs: ordered. Radiology: ordered.  Risk Prescription drug management.   Patient CBC white count up a little bit at 11.1 hemoglobin at 11.7 platelets 481.  Complete metabolic panel without any acute abnormalities liver function test are normal.  Electrolytes normal renal function normal.  Lipase also normal.  Chest x-ray without any acute findings.  Since sensations pretty much at the base of the neck ordered CT soft tissue neck for further evaluation to see if there is any edema.  But is still pending.  Once we have the results of that we will discuss with on call [place] gastro[facility]ology.  Patient still having difficulty swallowing saliva is going down.  Patient states it is painful to swallow water it does go down.  Patient has not tried any food since this got stuck.  Patient given IV Protonix.  Did not try GI cocktail.  But that could be an option.   Final Clinical Impression(s) / ED Diagnoses Final diagnoses:  Esophageal dysphagia  Sensation of foreign body in esophagus    Rx / DC Orders ED Discharge Orders     None         Fredia Sorrow, MD 01/22/23 1446    Fredia Sorrow, MD 01/22/23 1446

## 2023-01-22 NOTE — ED Provider Notes (Signed)
Blood pressure (!) 148/68, pulse 69, temperature 98.5 F (36.9 C), temperature source Oral, resp. rate 16, height 5\' 7"  (1.702 m), weight 117.9 kg, SpO2 100 %.  Assuming care from Dr. Rogene Houston.  In short, Fred[place] L Cassone is a 52 y.o. female with a chief complaint of Sore Throat .  Refer to the original H&P for additional details.  The current plan of care is to follow up on CT neck and discuss with [facility].   Dependently interpreted the CT imaging of the neck and agree with radiology interpretation.  On my reassessment the patient she is speaking in a clear voice, managing secretions.  She is drinking fluids here in the emergency department without difficulty.  I discussed the case with [facility] Dr. Randel Pigg.  Plan for Carafate slurry and PPI over the weekend along with liquid diet.  Their office is closed tomorrow due to the holidays the patient will call on Monday for prompt follow-up appointment and consideration of upper endoscopy as an outpatient.  Discussed with patient that if symptoms worsen she should return to the emergency department, ideally to [facility] or [facility] as endoscopy can be performed at those locations.  Patient verbalized understanding and feels comfortable with the plan at discharge.    Margette Fast, MD 01/22/23 [phone]

## 2023-01-22 NOTE — ED Triage Notes (Addendum)
Pt reports esophageal narrowing  and while eating piece of chicken it became lodged  in throat . Pt able to dislodge chicken, but throat has blisters from hot chicken sitting in her throat  Occurred last night Very painful to swallow and feels like throat is swollen

## 2023-01-22 NOTE — Discharge Instructions (Signed)
You were seen in the emergency room today after swallowing some hot food.  We discussed the case with [facility], who you have seen before, and they would like for you to call the office first thing Monday morning to schedule an appointment where they can decide if upper endoscopy is necessary.  Over the weekend, I would like for you to stick mainly to the liquid diet.  I have called in some medications to your pharmacy to help with symptoms.  Please take as directed.  If you develop worsening symptoms please return to the emergency department but ideally I would have you go to [facility] or [facility] if you are able or call 911.

## 2023-01-22 NOTE — ED Notes (Addendum)
Spoke with patient about AVS including prescriptions and follow up care. All questions addressed. Pt ambulatory to ED exit without assistance with no acute distress.

## 2023-01-28 ENCOUNTER — Telehealth: Payer: Self-pay | Admitting: Gastroenterology

## 2023-01-28 NOTE — Telephone Encounter (Signed)
Left message on machine to call back  

## 2023-01-28 NOTE — Telephone Encounter (Signed)
Patient called stated she was just seen at the ED for Esophageal dysphagia and she is requesting to speak with a nurse regarding her symptoms and not having to wait for a month to be further evaluated. Offered next available 03/18/23.

## 2023-01-29 NOTE — Telephone Encounter (Signed)
Left message on machine to call back  

## 2023-01-30 NOTE — Telephone Encounter (Signed)
I have attempted to reach the pt x3 with no response. Will await a return call from the pt if she still has questions or concerns.  Several messages were left to have the pt return call.

## 2024-03-22 ENCOUNTER — Emergency Department (HOSPITAL_BASED_OUTPATIENT_CLINIC_OR_DEPARTMENT_OTHER)

## 2024-03-22 ENCOUNTER — Encounter (HOSPITAL_BASED_OUTPATIENT_CLINIC_OR_DEPARTMENT_OTHER): Payer: Self-pay | Admitting: Urology

## 2024-03-22 ENCOUNTER — Emergency Department (HOSPITAL_BASED_OUTPATIENT_CLINIC_OR_DEPARTMENT_OTHER)
Admission: EM | Admit: 2024-03-22 | Discharge: 2024-03-22 | Disposition: A | Discharge status: Home / Self Care | Attending: Emergency Medicine | Admitting: Emergency Medicine

## 2024-03-22 ENCOUNTER — Other Ambulatory Visit: Payer: Self-pay

## 2024-03-22 DIAGNOSIS — D649 Anemia, unspecified: Secondary | ICD-10-CM | POA: Diagnosis not present

## 2024-03-22 DIAGNOSIS — R059 Cough, unspecified: Secondary | ICD-10-CM | POA: Diagnosis present

## 2024-03-22 DIAGNOSIS — U071 COVID-19: Secondary | ICD-10-CM | POA: Diagnosis not present

## 2024-03-22 DIAGNOSIS — I10 Essential (primary) hypertension: Secondary | ICD-10-CM | POA: Insufficient documentation

## 2024-03-22 LAB — CBC
HCT: 33.9 % — ABNORMAL LOW (ref 36.0–46.0)
Hemoglobin: 10.9 g/dL — ABNORMAL LOW (ref 12.0–15.0)
MCH: 30.3 pg (ref 26.0–34.0)
MCHC: 32.2 g/dL (ref 30.0–36.0)
MCV: 94.2 fL (ref 80.0–100.0)
Platelets: 406 10*3/uL — ABNORMAL HIGH (ref 150–400)
RBC: 3.6 MIL/uL — ABNORMAL LOW (ref 3.87–5.11)
RDW: 14 % (ref 11.5–15.5)
WBC: 6.6 10*3/uL (ref 4.0–10.5)
nRBC: 0 % (ref 0.0–0.2)

## 2024-03-22 LAB — COMPREHENSIVE METABOLIC PANEL WITH GFR
ALT: 17 U/L (ref 0–44)
AST: 23 U/L (ref 15–41)
Albumin: 4.3 g/dL (ref 3.5–5.0)
Alkaline Phosphatase: 73 U/L (ref 38–126)
Anion gap: 15 (ref 5–15)
BUN: 9 mg/dL (ref 6–20)
CO2: 26 mmol/L (ref 22–32)
Calcium: 8.8 mg/dL — ABNORMAL LOW (ref 8.9–10.3)
Chloride: 102 mmol/L (ref 98–111)
Creatinine, Ser: 1 mg/dL (ref 0.44–1.00)
GFR, Estimated: >60 mL/min (ref >60)
Glucose, Bld: 100 mg/dL — ABNORMAL HIGH (ref 70–99)
Potassium: 3.5 mmol/L (ref 3.5–5.1)
Sodium: 143 mmol/L (ref 135–145)
Total Bilirubin: 0.4 mg/dL (ref 0.0–1.2)
Total Protein: 8.1 g/dL (ref 6.5–8.1)

## 2024-03-22 LAB — GROUP A STREP BY PCR: Group A Strep by PCR: NOT DETECTED

## 2024-03-22 LAB — RESP PANEL BY RT-PCR (RSV, FLU A&B, COVID)  RVPGX2
Influenza A by PCR: NEGATIVE
Influenza B by PCR: NEGATIVE
Resp Syncytial Virus by PCR: NEGATIVE
SARS Coronavirus 2 by RT PCR: POSITIVE — AB

## 2024-03-22 MED ORDER — BENZONATATE 100 MG PO CAPS
100.0000 mg | ORAL_CAPSULE | Freq: Once | ORAL | Status: AC
  Administered 2024-03-22: 100 mg via ORAL
  Filled 2024-03-22: qty 1

## 2024-03-22 MED ORDER — ONDANSETRON 4 MG PO TBDP: 4.0000 mg | ORAL_TABLET | Freq: Three times a day (TID) | ORAL | 0 refills | Status: AC | PRN

## 2024-03-22 MED ORDER — IBUPROFEN 800 MG PO TABS
800.0000 mg | ORAL_TABLET | Freq: Four times a day (QID) | ORAL | Status: DC | PRN
  Administered 2024-03-22: 800 mg via ORAL
  Filled 2024-03-22: qty 1

## 2024-03-22 MED ORDER — BENZONATATE 100 MG PO CAPS: 100.0000 mg | ORAL_CAPSULE | Freq: Three times a day (TID) | ORAL | 0 refills | Status: AC

## 2024-03-22 MED ORDER — LIDOCAINE VISCOUS HCL 2 % MT SOLN
15.0000 mL | Freq: Once | OROMUCOSAL | Status: AC
  Administered 2024-03-22: 15 mL via OROMUCOSAL
  Filled 2024-03-22: qty 15

## 2024-03-22 NOTE — ED Notes (Signed)
 Pt. Reporting cough and headache since 5th day of her cruise and has been home from the [place] cruise since Sat. Of this past week.  Pt. In no distress with cough and congestion.

## 2024-03-22 NOTE — ED Provider Notes (Signed)
 [facility] AT [facility] Provider Note   CSN: [id number] Arrival date & time: 03/22/24  1236     History  Chief Complaint  Patient presents with   Flu like symptoms     Martha Davis is a 53 y.o. female.  Patient with past medical history of anemia, dysphagia, hypertension is presenting to emergency room with complaint of 4 days of chills body aches cough sore throat HA and congestion.  She has not noted significant shortness of breath or chest pain.  She has not noticed fever.  Reports she was recently on a cruise ship and airplane.  She reports her headache comes and goes and is tension type in nature.  She did not have any injury trauma or fall.  No sudden onset or severe headache.  No neck rigidity.  Unknown sick contact.  Denies any associated diarrhea or vomiting.  Denies any swelling in feet and ankles.  HPI     Home Medications Prior to Admission medications   Medication Sig Start Date End Date Taking? Authorizing Provider  albuterol  (VENTOLIN  HFA) 108 (90 Base) MCG/ACT inhaler Inhale 1-2 puffs into the lungs every 6 (six) hours as needed for wheezing or shortness of breath. 10/25/22   Raspet, Erin K, PA-C  famotidine  (PEPCID ) 20 MG tablet Take 2 tablets (40 mg total) by mouth daily. 09/25/21   Floyd, Dan, DO  fluticasone  (FLONASE ) 50 MCG/ACT nasal spray Place 1 spray into both nostrils daily. 10/25/22   Raspet, Erin K, PA-C  pantoprazole  sodium (PROTONIX ) 40 mg Take 40 mg by mouth daily for 14 days. 01/22/23 02/05/23  Long, Shereen Dike, MD  promethazine -dextromethorphan (PROMETHAZINE -DM) 6.25-15 MG/5ML syrup Take 5 mLs by mouth 2 (two) times daily as needed for cough. 10/25/22   Raspet, Erin K, PA-C  sucralfate  (CARAFATE ) 1 GM/10ML suspension Take 10 mLs (1 g total) by mouth 3 (three) times daily for 7 days. 01/22/23 01/29/23  Long, Shereen Dike, MD      Allergies    Patient has no known allergies.    Review of Systems   Review of Systems  HENT:   Positive for congestion.     Physical Exam Updated Vital Signs BP (!) 144/97 (BP Location: Left Arm)   Pulse 76   Temp (!) 97.5 F (36.4 C)   Resp 20   Ht 5\' 7"  (1.702 m)   Wt 117.9 kg   SpO2 99%   BMI 40.71 kg/m  Physical Exam Vitals and nursing note reviewed.  Constitutional:      General: She is not in acute distress.    Appearance: She is not toxic-appearing.  HENT:     Head: Normocephalic and atraumatic.  Eyes:     General: No scleral icterus.    Conjunctiva/sclera: Conjunctivae normal.  Neck:     Comments: No meningeal signs or neck rigidity. Cardiovascular:     Rate and Rhythm: Normal rate and regular rhythm.     Pulses: Normal pulses.     Heart sounds: Normal heart sounds.  Pulmonary:     Effort: Pulmonary effort is normal. No respiratory distress.     Breath sounds: Normal breath sounds.  Chest:     Chest wall: No tenderness.  Abdominal:     General: Abdomen is flat. Bowel sounds are normal.     Palpations: Abdomen is soft.     Tenderness: There is no abdominal tenderness.  Musculoskeletal:     Cervical back: No rigidity.  Right lower leg: No edema.     Left lower leg: No edema.  Skin:    General: Skin is warm and dry.     Findings: No lesion.  Neurological:     General: No focal deficit present.     Mental Status: She is alert and oriented to person, place, and time. Mental status is at baseline.     Comments: Patient is alert and oriented moving extremities without any difficulty.  No slurred speech.     ED Results / Procedures / Treatments   Labs (all labs ordered are listed, but only abnormal results are displayed) Labs Reviewed  RESP PANEL BY RT-PCR (RSV, FLU A&B, COVID)  RVPGX2 - Abnormal; Notable for the following components:      Result Value   SARS Coronavirus 2 by RT PCR POSITIVE (*)    All other components within normal limits  CBC - Abnormal; Notable for the following components:   RBC 3.60 (*)    Hemoglobin 10.9 (*)    HCT 33.9  (*)    Platelets 406 (*)    All other components within normal limits  COMPREHENSIVE METABOLIC PANEL WITH GFR - Abnormal; Notable for the following components:   Glucose, Bld 100 (*)    Calcium 8.8 (*)    All other components within normal limits  GROUP A STREP BY PCR    EKG None  Radiology DG Chest 2 View Result Date: 03/22/2024 CLINICAL DATA:  Cough. EXAM: CHEST - 2 VIEW COMPARISON:  01/22/2023. FINDINGS: The heart size and mediastinal contours are within normal limits. No focal consolidation, pleural effusion, or pneumothorax. No acute osseous abnormality. IMPRESSION: No acute cardiopulmonary findings. Electronically Signed   By: Martha Davis M.D.   On: 03/22/2024 16:38    Procedures Procedures    Medications Ordered in ED Medications - No data to display  ED Course/ Medical Decision Making/ A&P                                 Medical Decision Making Amount and/or Complexity of Data Reviewed Labs: ordered. Radiology: ordered.  Risk Prescription drug management.   [name] 53 y.o. presented today for URI like symptoms. Working DDx that I considered at this time includes, but not limited to, viral illness, pharyngitis, mono, sinusitis, electrolyte abnormality, AOM.  R/o DDx: these additional diagnoses are not consistent with patient's history, presentation, physical exam, labs/imaging findings.  Labs:  Respiratory Panel: COVID positive  Strep negative  CBC no leukocytosis, mild anemia 10.9 CMP no electrolyte abnormality, normal liver and kidney function   Imaging - Chest x-ray:  Negative   Problem List / ED Course / Critical interventions / Medication management  Patient presenting to emergency room with complaint of flulike symptoms.  She had recent out of country travel but no sign of DVT or PE on exam.  Her symptoms are most consistent with viral illness thus we will screen for flu COVID RSV.  Given sore throat will obtain strep panel.  Given nausea  and general muscle aches obtaining labs.  Will rule out pneumonia with chest x-ray. Patient tested positive for COVID.  Otherwise reassuring workup without significant leukocytosis or electrolyte abnormality.  She has normal kidney function.  Vital stable throughout stay.  Feel appropriate for discharge.  I ordered medication including 1 for muscle aches and headache. Reevaluation of the patient after these medicines showed that the patient improved  Patients vitals assessed. Upon arrival patient is  hemodynamically stable.  I have reviewed the patients home medicines and have made adjustments as needed    Plan: F/u w/ PCP in 2-3d to ensure resolution of sx.  Patient was given return precautions. Patient stable for discharge at this time.  Patient educated on sx and dx and verbalized understanding of plan. Return to ER if new or worsening sx.          Final Clinical Impression(s) / ED Diagnoses Final diagnoses:  COVID  Anemia, unspecified type    Rx / DC Orders ED Discharge Orders          Ordered    benzonatate (TESSALON) 100 MG capsule  Every 8 hours        03/22/24 1432    ondansetron (ZOFRAN-ODT) 4 MG disintegrating tablet  Every 8 hours PRN        03/22/24 1432              Jaelen Gellerman, Kandace Organ, PA-C 03/22/24 1651    Long, Joshua G, MD 03/23/24 [phone]

## 2024-03-22 NOTE — ED Triage Notes (Signed)
 Pt states chills, body aches, cough, congestion since Thursday  Taking OTC meds with no relief   Was out of country last week

## 2024-03-22 NOTE — Discharge Instructions (Signed)
 You were seen in the emergency room today for COVID.  Have sent Tessalon Perles to use as needed for cough.  I have sent Zofran as needed for nausea vomiting.  Make sure staying well-hydrated drink lots of water and you could alternate Pedialyte Gatorade.  For sore throat try cough drops.  Can also try warm tea with honey and lemon.  Try Tylenol  and ibuprofen  for muscle aches or fever.  Return to ER with new or worsening symptoms.
